# Patient Record
Sex: Female | Born: 1965 | Race: White | Hispanic: No | Marital: Married | Smoking: Never smoker
Health system: Southern US, Community
[De-identification: ages and names within clinical notes are randomized; demographics above are authoritative.]

## PROBLEM LIST (undated history)

## (undated) DIAGNOSIS — K9041 Non-celiac gluten sensitivity: Secondary | ICD-10-CM

## (undated) DIAGNOSIS — M720 Palmar fascial fibromatosis [Dupuytren]: Secondary | ICD-10-CM

## (undated) HISTORY — DX: Non-celiac gluten sensitivity: K90.41

## (undated) HISTORY — DX: Palmar fascial fibromatosis (dupuytren): M72.0

---

## 2001-09-25 LAB — HM COLONOSCOPY: HM Colonoscopy: NORMAL

## 2004-09-20 ENCOUNTER — Ambulatory Visit: Payer: Self-pay | Admitting: Obstetrics and Gynecology

## 2005-12-03 ENCOUNTER — Ambulatory Visit: Payer: Self-pay | Admitting: Obstetrics and Gynecology

## 2006-12-24 ENCOUNTER — Ambulatory Visit: Payer: Self-pay | Admitting: Obstetrics and Gynecology

## 2008-03-30 ENCOUNTER — Ambulatory Visit: Payer: Self-pay | Admitting: Obstetrics and Gynecology

## 2009-05-17 ENCOUNTER — Ambulatory Visit: Payer: Self-pay | Admitting: Obstetrics and Gynecology

## 2010-09-26 ENCOUNTER — Ambulatory Visit: Payer: Self-pay | Admitting: Obstetrics and Gynecology

## 2011-03-30 ENCOUNTER — Emergency Department: Payer: Self-pay | Admitting: Unknown Physician Specialty

## 2011-07-27 LAB — HM PAP SMEAR

## 2011-11-28 ENCOUNTER — Ambulatory Visit: Payer: Self-pay | Admitting: Obstetrics and Gynecology

## 2012-09-25 ENCOUNTER — Ambulatory Visit (INDEPENDENT_AMBULATORY_CARE_PROVIDER_SITE_OTHER): Payer: BC Managed Care – PPO | Admitting: Internal Medicine

## 2012-09-25 ENCOUNTER — Encounter: Payer: Self-pay | Admitting: Internal Medicine

## 2012-09-25 VITALS — BP 121/76 | HR 77 | Temp 98.6°F | Ht 64.5 in | Wt 131.0 lb

## 2012-09-25 DIAGNOSIS — Z309 Encounter for contraceptive management, unspecified: Secondary | ICD-10-CM

## 2012-09-25 DIAGNOSIS — Z0000 Encounter for general adult medical examination without abnormal findings: Secondary | ICD-10-CM

## 2012-09-25 DIAGNOSIS — IMO0001 Reserved for inherently not codable concepts without codable children: Secondary | ICD-10-CM

## 2012-09-25 DIAGNOSIS — Z23 Encounter for immunization: Secondary | ICD-10-CM

## 2012-09-25 LAB — COMPREHENSIVE METABOLIC PANEL
ALT: 19 U/L (ref 0–35)
BUN: 10 mg/dL (ref 6–23)
CO2: 31 mEq/L (ref 19–32)
Calcium: 9 mg/dL (ref 8.4–10.5)
Chloride: 102 mEq/L (ref 96–112)
Creatinine, Ser: 0.8 mg/dL (ref 0.4–1.2)
GFR: 86.21 mL/min (ref >60.00)

## 2012-09-25 LAB — CBC WITH DIFFERENTIAL/PLATELET
Basophils Relative: 0.9 % (ref 0.0–3.0)
Hemoglobin: 13.2 g/dL (ref 12.0–15.0)
Lymphocytes Relative: 27.2 % (ref 12.0–46.0)
Monocytes Relative: 5.7 % (ref 3.0–12.0)
Neutro Abs: 3.4 10*3/uL (ref 1.4–7.7)
RBC: 4.48 Mil/uL (ref 3.87–5.11)

## 2012-09-25 LAB — LDL CHOLESTEROL, DIRECT: Direct LDL: 146.4 mg/dL

## 2012-09-25 LAB — LIPID PANEL
Cholesterol: 212 mg/dL — ABNORMAL HIGH (ref 0–200)
HDL: 46.2 mg/dL (ref >39.00)
VLDL: 33.2 mg/dL (ref 0.0–40.0)

## 2012-09-25 MED ORDER — LEVONORGESTREL-ETHINYL ESTRAD 0.1-20 MG-MCG PO TABS: 1.0000 | ORAL_TABLET | Freq: Every day | ORAL | Status: DC

## 2012-09-26 DIAGNOSIS — IMO0001 Reserved for inherently not codable concepts without codable children: Secondary | ICD-10-CM | POA: Insufficient documentation

## 2012-09-26 DIAGNOSIS — Z0000 Encounter for general adult medical examination without abnormal findings: Secondary | ICD-10-CM | POA: Insufficient documentation

## 2012-09-26 LAB — VITAMIN D 25 HYDROXY (VIT D DEFICIENCY, FRACTURES): Vit D, 25-Hydroxy: 43 ng/mL (ref 30–89)

## 2012-09-26 NOTE — Assessment & Plan Note (Signed)
General medical exam is normal today. Patient follows a healthy diet and gets regular physical activity. Will check basic lab work including CBC, CMP, lipid profile, TSH, vitamin D. Health maintenance including vaccinations are up-to-date. Will obtain previous records including previous reports on Pap and mammogram. Followup one year or sooner as needed.

## 2012-09-26 NOTE — Assessment & Plan Note (Signed)
Will refill oral contraceptive pill today. Will obtain records on previous GYN evaluation including Pap.

## 2012-09-26 NOTE — Progress Notes (Signed)
Subjective:    Patient ID: Leslie Ruiz, female    DOB: November 20, 1966, 46 y.o.   MRN: [id number]  HPI 46 year old female presents to establish care. She reports that she is generally feeling well. She notes that several years ago, she was told she might have fibromyalgia because of diffuse and pains. She ultimately decided to follow a gluten-free diet and reports that symptoms completely resolved after this change. She follows a healthy diet and gets regular physical activity by walking and horseback riding. She denies any concerns about her health. She takes oral contraceptive pill and recently had her menstrual cycle. She reports the past and has been normal. She is up-to-date on other health maintenance, with the exception of lab work.  Outpatient Encounter Prescriptions as of 09/25/2012  Medication Sig Dispense Refill  . levonorgestrel-ethinyl estradiol (AVIANE) 0.1-20 MG-MCG tablet Take 1 tablet by mouth daily.  1 Package  11   BP 121/76  Pulse 77  Temp 98.6 F (37 C) (Oral)  Ht 5' 4.5" (1.638 m)  Wt 131 lb (59.421 kg)  BMI 22.14 kg/m2  SpO2 100%  LMP 09/21/2012  Review of Systems  Constitutional: Negative for fever, chills, appetite change, fatigue and unexpected weight change.  HENT: Negative for ear pain, congestion, sore throat, trouble swallowing, neck pain, voice change and sinus pressure.   Eyes: Negative for visual disturbance.  Respiratory: Negative for cough, shortness of breath, wheezing and stridor.   Cardiovascular: Negative for chest pain, palpitations and leg swelling.  Gastrointestinal: Negative for nausea, vomiting, abdominal pain, diarrhea, constipation, blood in stool, abdominal distention and anal bleeding.  Genitourinary: Negative for dysuria and flank pain.  Musculoskeletal: Negative for myalgias, arthralgias and gait problem.  Skin: Negative for color change and rash.  Neurological: Negative for dizziness and headaches.  Hematological: Negative for  adenopathy. Does not bruise/bleed easily.  Psychiatric/Behavioral: Negative for suicidal ideas, disturbed wake/sleep cycle and dysphoric mood. The patient is not nervous/anxious.        Objective:   Physical Exam  Constitutional: She is oriented to person, place, and time. She appears well-developed and well-nourished. No distress.  HENT:  Head: Normocephalic and atraumatic.  Right Ear: External ear normal.  Left Ear: External ear normal.  Nose: Nose normal.  Mouth/Throat: Oropharynx is clear and moist. No oropharyngeal exudate.  Eyes: Conjunctivae normal are normal. Pupils are equal, round, and reactive to light. Right eye exhibits no discharge. Left eye exhibits no discharge. No scleral icterus.  Neck: Normal range of motion. Neck supple. No tracheal deviation present. No thyromegaly present.  Cardiovascular: Normal rate, regular rhythm, normal heart sounds and intact distal pulses.  Exam reveals no gallop and no friction rub.   No murmur heard. Pulmonary/Chest: Effort normal and breath sounds normal. No respiratory distress. She has no wheezes. She has no rales. She exhibits no tenderness.  Abdominal: Soft. Bowel sounds are normal. She exhibits no distension and no mass. There is no tenderness. There is no rebound and no guarding.  Musculoskeletal: Normal range of motion. She exhibits no edema and no tenderness.  Lymphadenopathy:    She has no cervical adenopathy.  Neurological: She is alert and oriented to person, place, and time. No cranial nerve deficit. She exhibits normal muscle tone. Coordination normal.  Skin: Skin is warm and dry. No rash noted. She is not diaphoretic. No erythema. No pallor.  Psychiatric: She has a normal mood and affect. Her behavior is normal. Judgment and thought content normal.  Assessment & Plan:

## 2012-09-29 ENCOUNTER — Encounter: Payer: Self-pay | Admitting: *Deleted

## 2012-09-29 NOTE — Progress Notes (Signed)
Result letter mailed to patient's home address.

## 2012-11-30 ENCOUNTER — Ambulatory Visit: Payer: Self-pay | Admitting: Obstetrics and Gynecology

## 2012-12-02 ENCOUNTER — Ambulatory Visit: Payer: Self-pay | Admitting: Obstetrics and Gynecology

## 2013-01-18 ENCOUNTER — Telehealth: Payer: Self-pay | Admitting: Internal Medicine

## 2013-01-18 MED ORDER — PENCICLOVIR 1 % EX CREA: TOPICAL_CREAM | CUTANEOUS | Status: DC

## 2013-01-18 NOTE — Telephone Encounter (Signed)
Could you please advise patient about this?

## 2013-01-18 NOTE — Telephone Encounter (Signed)
Called pt she is meeting wit ha client first thing at 8 am and is wondering if there was anything over the counter she could try. She is really stressing out because she has to meet with clients in the morning and needing these to go away as quickly as possible. She says she can't really come in the office and was wanting to know if she can't have anything called in without being seen if there was something over the counter she could take or try ???

## 2013-01-18 NOTE — Telephone Encounter (Signed)
Called pt Left message on machine  

## 2013-01-18 NOTE — Telephone Encounter (Signed)
I can see her at 8:00 in the am.  Block the 8:15 slot.  Thanks.

## 2013-01-18 NOTE — Telephone Encounter (Signed)
Prescription sent in for denavir.  Pt informed.  Will let us know if problems.

## 2013-01-18 NOTE — Telephone Encounter (Signed)
Pt has had cold sore for a week and is meeting with clients this week and is needing them to go away. She has tried Abreva and Orajel but they have not worked well. Can you recommend anything else or call in rx strength for pt ??? Pt uses [facility] in [place]

## 2013-06-04 ENCOUNTER — Encounter: Payer: Self-pay | Admitting: Adult Health

## 2013-06-04 ENCOUNTER — Ambulatory Visit: Payer: Self-pay | Admitting: Adult Health

## 2013-06-04 ENCOUNTER — Ambulatory Visit (INDEPENDENT_AMBULATORY_CARE_PROVIDER_SITE_OTHER): Payer: BC Managed Care – PPO | Admitting: Adult Health

## 2013-06-04 VITALS — BP 118/70 | HR 95 | Temp 98.3°F | Resp 12 | Wt 130.0 lb

## 2013-06-04 DIAGNOSIS — M79609 Pain in unspecified limb: Secondary | ICD-10-CM

## 2013-06-04 DIAGNOSIS — M79642 Pain in left hand: Secondary | ICD-10-CM | POA: Insufficient documentation

## 2013-06-04 NOTE — Progress Notes (Signed)
  Subjective:    Patient ID: Leslie Ruiz, female    DOB: Feb 17, 1966, 47 y.o.   MRN: [id number]  HPI  Is a pleasant 47 year old female who presents to clinic with complaints of left hand pain. She is unable to fully extend the left ring finger. She has woken up with the finger flexed with gradual extension throughout the day. Painful to fully extend. She does not recall a specific injury to the finger. She reports riding horses and may have hurt hand while riding but she does not recall feeling pain or any specific event when this started. She reports that this has been ongoing since April. She has not seen anybody for this problem thinking that it would subside on its own. She has not taken any medication.   Current Outpatient Prescriptions on File Prior to Visit  Medication Sig Dispense Refill  . levonorgestrel-ethinyl estradiol (AVIANE) 0.1-20 MG-MCG tablet Take 1 tablet by mouth daily.  1 Package  11   No current facility-administered medications on file prior to visit.     Review of Systems  Musculoskeletal:       Pain left hand. Unable to extend ring finger       Objective:   Physical Exam  Constitutional: She is oriented to person, place, and time. She appears well-developed and well-nourished. No distress.  Musculoskeletal:  No pain upon palpating left phalanges. There is slight discomfort with palpation of 4th metacarpus. There is a slight protrusion palpated at the distal 4th metarcarpus. There is no swelling noted.   Neurological: She is alert and oriented to person, place, and time. She has normal reflexes.  Skin: Skin is warm and dry.  Psychiatric: She has a normal mood and affect. Her behavior is normal. Thought content normal.          Assessment & Plan:

## 2013-06-04 NOTE — Patient Instructions (Addendum)
  Please go to the [facility] at [facility] for xray of the left hand.  I will notify you of results once they are available.  Possible referral to hand specialist.

## 2013-06-04 NOTE — Assessment & Plan Note (Signed)
Pain is mainly involving the ring finger of the left hand with inability to fully extend. Suspect there may be involvement of the tendon of the flexor digitorum superficialis muscle. The protrusion that is palpated over the fourth metacarpal at the distal end may be 2/2 spur vs. Cyst. Will send for xray of left hand to r/o bone deformity, fracture, etc.  Consider referral to hand specialist.

## 2013-06-09 ENCOUNTER — Telehealth: Payer: Self-pay | Admitting: *Deleted

## 2013-06-09 ENCOUNTER — Other Ambulatory Visit: Payer: Self-pay | Admitting: Adult Health

## 2013-06-09 NOTE — Telephone Encounter (Signed)
Left message, notifying referral in progress.

## 2013-06-09 NOTE — Telephone Encounter (Signed)
Message copied by Dema Severin on Wed Jun 09, 2013  8:37 AM ------      Message from: Novella Olive      Created: Mon Jun 07, 2013  8:35 AM       Xray of the hand was normal which is what I suspected. Patient may take over the counter anti inflammatories such as ibuprofen 600 mg q6h prn for pain. Apply ice alternating with heat. Massage the area and gently try to flex and extend fingers. She can use a splint to rest and immobilize the hand with fingers extended. If no improvement in 7-10 days can refer to hand specialist. ------

## 2013-06-09 NOTE — Telephone Encounter (Signed)
Ordered referral.

## 2013-06-09 NOTE — Telephone Encounter (Signed)
Called and advised patient of results. Pt requests referral to hand specialist now as this has been "going on since April".

## 2013-06-18 ENCOUNTER — Encounter: Payer: Self-pay | Admitting: Adult Health

## 2013-09-30 ENCOUNTER — Encounter: Payer: Self-pay | Admitting: Internal Medicine

## 2013-09-30 ENCOUNTER — Ambulatory Visit (INDEPENDENT_AMBULATORY_CARE_PROVIDER_SITE_OTHER): Payer: BC Managed Care – PPO | Admitting: Internal Medicine

## 2013-09-30 ENCOUNTER — Encounter (INDEPENDENT_AMBULATORY_CARE_PROVIDER_SITE_OTHER): Payer: Self-pay

## 2013-09-30 VITALS — BP 118/72 | HR 86 | Temp 98.5°F | Ht 64.5 in | Wt 132.0 lb

## 2013-09-30 DIAGNOSIS — M79642 Pain in left hand: Secondary | ICD-10-CM

## 2013-09-30 DIAGNOSIS — M79609 Pain in unspecified limb: Secondary | ICD-10-CM

## 2013-09-30 DIAGNOSIS — Z23 Encounter for immunization: Secondary | ICD-10-CM

## 2013-09-30 DIAGNOSIS — Z0000 Encounter for general adult medical examination without abnormal findings: Secondary | ICD-10-CM | POA: Insufficient documentation

## 2013-09-30 LAB — CBC WITH DIFFERENTIAL/PLATELET
Basophils Absolute: 0 10*3/uL (ref 0.0–0.1)
Eosinophils Absolute: 0.1 10*3/uL (ref 0.0–0.7)
HCT: 38.2 % (ref 36.0–46.0)
Hemoglobin: 12.8 g/dL (ref 12.0–15.0)
Lymphs Abs: 1.3 10*3/uL (ref 0.7–4.0)
MCHC: 33.6 g/dL (ref 30.0–36.0)
MCV: 86.7 fl (ref 78.0–100.0)
Monocytes Absolute: 0.3 10*3/uL (ref 0.1–1.0)
Neutro Abs: 4.2 10*3/uL (ref 1.4–7.7)
RDW: 13.6 % (ref 11.5–14.6)

## 2013-09-30 LAB — MICROALBUMIN / CREATININE URINE RATIO
Creatinine,U: 66.2 mg/dL
Microalb Creat Ratio: 0.9 mg/g (ref 0.0–30.0)
Microalb, Ur: 0.6 mg/dL (ref 0.0–1.9)

## 2013-09-30 NOTE — Assessment & Plan Note (Signed)
Will set up evaluation with Dr. Solon Augusta at [facility] to see if additional intervention might be helpful, given no improvement with steroid injection.

## 2013-09-30 NOTE — Progress Notes (Signed)
Pre-visit discussion using our clinic review tool. No additional management support is needed unless otherwise documented below in the visit note.  

## 2013-09-30 NOTE — Progress Notes (Signed)
Subjective:    Patient ID: Leslie Ruiz, female    DOB: 1966-09-30, 47 y.o.   MRN: [id number]  HPI 47 year old female presents for annual exam. She reports she is generally feeling well. She continues to follow a gluten-free diet. She reports that symptoms of bowel cramping and diarrhea are completely resolved with gluten-free diet. She also exercises regularly. No new concerns today. She is scheduled for GYN exam with Pap smear with her OB/GYN later this month. Mammogram is pending. Colonoscopy is up-to-date.  She was seen in clinic earlier this year for left hand pain diagnosed as Dupuytren contracture. She was seen by an orthopedic surgeon who performed steroid injection. She reports that she initially had some benefit but now has persistent contracture of her left fourth finger.  Outpatient Encounter Prescriptions as of 09/30/2013  Medication Sig  . ESTRACE VAGINAL 0.1 MG/GM vaginal cream   . levonorgestrel-ethinyl estradiol (AVIANE) 0.1-20 MG-MCG tablet Take 1 tablet by mouth daily.   BP 118/72  Pulse 86  Temp(Src) 98.5 F (36.9 C) (Oral)  Ht 5' 4.5" (1.638 m)  Wt 132 lb (59.875 kg)  BMI 22.32 kg/m2  SpO2 98%  Review of Systems  Constitutional: Negative for fever, chills, appetite change, fatigue and unexpected weight change.  HENT: Negative for congestion, ear pain, sinus pressure, sore throat, trouble swallowing and voice change.   Eyes: Negative for visual disturbance.  Respiratory: Negative for cough, shortness of breath, wheezing and stridor.   Cardiovascular: Negative for chest pain, palpitations and leg swelling.  Gastrointestinal: Negative for nausea, vomiting, abdominal pain, diarrhea, constipation, blood in stool, abdominal distention and anal bleeding.  Genitourinary: Negative for dysuria and flank pain.  Musculoskeletal: Negative for arthralgias, gait problem, myalgias and neck pain.  Skin: Negative for color change and rash.  Neurological: Negative for dizziness  and headaches.  Hematological: Negative for adenopathy. Does not bruise/bleed easily.  Psychiatric/Behavioral: Negative for suicidal ideas, sleep disturbance and dysphoric mood. The patient is not nervous/anxious.        Objective:   Physical Exam  Constitutional: She is oriented to person, place, and time. She appears well-developed and well-nourished. No distress.  HENT:  Head: Normocephalic and atraumatic.  Right Ear: External ear normal.  Left Ear: External ear normal.  Nose: Nose normal.  Mouth/Throat: Oropharynx is clear and moist. No oropharyngeal exudate.  Eyes: Conjunctivae are normal. Pupils are equal, round, and reactive to light. Right eye exhibits no discharge. Left eye exhibits no discharge. No scleral icterus.  Neck: Normal range of motion. Neck supple. No tracheal deviation present. No thyromegaly present.  Cardiovascular: Normal rate, regular rhythm, normal heart sounds and intact distal pulses.  Exam reveals no gallop and no friction rub.   No murmur heard. Pulmonary/Chest: Effort normal and breath sounds normal. No accessory muscle usage. Not tachypneic. No respiratory distress. She has no decreased breath sounds. She has no wheezes. She has no rales.  Abdominal: Soft. Bowel sounds are normal. She exhibits no distension and no mass. There is no tenderness. There is no rebound and no guarding.  Musculoskeletal: Normal range of motion. She exhibits no edema and no tenderness.       Hands: Lymphadenopathy:    She has no cervical adenopathy.  Neurological: She is alert and oriented to person, place, and time. No cranial nerve deficit. She exhibits normal muscle tone. Coordination normal.  Skin: Skin is warm and dry. No rash noted. She is not diaphoretic. No erythema. No pallor.  Psychiatric: She has a normal mood  and affect. Her behavior is normal. Judgment and thought content normal.          Assessment & Plan:

## 2013-09-30 NOTE — Assessment & Plan Note (Signed)
General medical exam normal today. Scheduled for PAP and pelvic with OB later this month. Encouraged continued healthy diet and regular exercise. Labs today CBC, CMP, lipids, TSH, Vit D. Follow up 1 year and prn.

## 2013-10-01 ENCOUNTER — Encounter: Payer: Self-pay | Admitting: *Deleted

## 2013-10-01 LAB — LIPID PANEL
Cholesterol: 211 mg/dL — ABNORMAL HIGH (ref 0–200)
HDL: 49 mg/dL (ref >39.00)
Total CHOL/HDL Ratio: 4
Triglycerides: 139 mg/dL (ref 0.0–149.0)
VLDL: 27.8 mg/dL (ref 0.0–40.0)

## 2013-10-01 LAB — COMPREHENSIVE METABOLIC PANEL
AST: 24 U/L (ref 0–37)
BUN: 13 mg/dL (ref 6–23)
CO2: 31 mEq/L (ref 19–32)
Calcium: 9.3 mg/dL (ref 8.4–10.5)
Chloride: 102 mEq/L (ref 96–112)
Creatinine, Ser: 0.7 mg/dL (ref 0.4–1.2)
GFR: 94.39 mL/min (ref >60.00)
Total Bilirubin: 0.4 mg/dL (ref 0.3–1.2)

## 2013-10-25 DIAGNOSIS — M720 Palmar fascial fibromatosis [Dupuytren]: Secondary | ICD-10-CM | POA: Insufficient documentation

## 2013-12-06 ENCOUNTER — Ambulatory Visit: Payer: Self-pay | Admitting: Obstetrics and Gynecology

## 2013-12-31 LAB — HM MAMMOGRAPHY: HM MAMMO: NORMAL

## 2014-06-01 ENCOUNTER — Encounter: Payer: Self-pay | Admitting: Internal Medicine

## 2014-06-01 ENCOUNTER — Ambulatory Visit (INDEPENDENT_AMBULATORY_CARE_PROVIDER_SITE_OTHER): Payer: BC Managed Care – PPO | Admitting: Internal Medicine

## 2014-06-01 VITALS — BP 110/70 | HR 110 | Temp 98.5°F | Ht 64.5 in | Wt 126.2 lb

## 2014-06-01 DIAGNOSIS — R0982 Postnasal drip: Secondary | ICD-10-CM

## 2014-06-01 DIAGNOSIS — R42 Dizziness and giddiness: Secondary | ICD-10-CM

## 2014-06-01 LAB — COMPREHENSIVE METABOLIC PANEL
ALBUMIN: 3.9 g/dL (ref 3.5–5.2)
ALT: 19 U/L (ref 0–35)
AST: 27 U/L (ref 0–37)
Alkaline Phosphatase: 71 U/L (ref 39–117)
BUN: 10 mg/dL (ref 6–23)
CALCIUM: 9.9 mg/dL (ref 8.4–10.5)
CHLORIDE: 102 meq/L (ref 96–112)
CO2: 23 mEq/L (ref 19–32)
Creatinine, Ser: 0.8 mg/dL (ref 0.4–1.2)
GFR: 78.43 mL/min (ref >60.00)
Glucose, Bld: 96 mg/dL (ref 70–99)
POTASSIUM: 5 meq/L (ref 3.5–5.1)
SODIUM: 139 meq/L (ref 135–145)
TOTAL PROTEIN: 7.8 g/dL (ref 6.0–8.3)
Total Bilirubin: 0.3 mg/dL (ref 0.2–1.2)

## 2014-06-01 LAB — CBC WITH DIFFERENTIAL/PLATELET
BASOS PCT: 0.4 % (ref 0.0–3.0)
Basophils Absolute: 0 10*3/uL (ref 0.0–0.1)
Eosinophils Absolute: 0.1 10*3/uL (ref 0.0–0.7)
Eosinophils Relative: 2.1 % (ref 0.0–5.0)
HCT: 40.6 % (ref 36.0–46.0)
Hemoglobin: 13.7 g/dL (ref 12.0–15.0)
Lymphocytes Relative: 17.7 % (ref 12.0–46.0)
Lymphs Abs: 1.2 10*3/uL (ref 0.7–4.0)
MCHC: 33.7 g/dL (ref 30.0–36.0)
MCV: 86.8 fl (ref 78.0–100.0)
MONO ABS: 0.3 10*3/uL (ref 0.1–1.0)
Monocytes Relative: 5.2 % (ref 3.0–12.0)
NEUTROS PCT: 74.6 % (ref 43.0–77.0)
Neutro Abs: 4.9 10*3/uL (ref 1.4–7.7)
PLATELETS: 307 10*3/uL (ref 150.0–400.0)
RBC: 4.68 Mil/uL (ref 3.87–5.11)
RDW: 13.2 % (ref 11.5–15.5)
WBC: 6.6 10*3/uL (ref 4.0–10.5)

## 2014-06-01 LAB — T4, FREE: Free T4: 0.86 ng/dL (ref 0.60–1.60)

## 2014-06-01 LAB — TSH: TSH: 0.36 u[IU]/mL (ref 0.35–4.50)

## 2014-06-01 NOTE — Progress Notes (Signed)
Pre visit review using our clinic review tool, if applicable. No additional management support is needed unless otherwise documented below in the visit note. 

## 2014-06-01 NOTE — Patient Instructions (Signed)
Labs today.  We will call you with results. 

## 2014-06-01 NOTE — Assessment & Plan Note (Signed)
We discussed potential causes of dizziness. Symptoms sound most consistent with BPV, however constellation of other symptoms and exam suggest possible hyperthyroidism. Will check thyroid function with labs. Discussed using Meclizine, however she has not tolerated this well in the past. If labs normal, discussed ENT evaluation to better assess middle ear pressure and consider Epley maneuver.

## 2014-06-01 NOTE — Progress Notes (Signed)
Subjective:    Patient ID: Leslie Ruiz, female    DOB: 11/22/1966, 48 y.o.   MRN: [id number]  HPI 48YO female presents for acute visit.  Dizziness - Having vertigo and lightheadedness for 1 week, starting last Tuesday. Similar episode occurred in June. Worsened by movement of her head. No recent fever. Some headache with aching in base of neck, not severe. Alleviated with tylenol. No rash. Some congestion last week. Having diarrhea last couple of days.  Feeling shaky between meals. Feels the need to clear her throat constantly, which has been going on for months. No trouble swallowing.  Review of Systems  Constitutional: Negative for fever, chills, appetite change, fatigue and unexpected weight change.  HENT: Positive for congestion, postnasal drip and rhinorrhea. Negative for sore throat, trouble swallowing and voice change.   Eyes: Negative for visual disturbance.  Respiratory: Negative for shortness of breath, wheezing and stridor.   Cardiovascular: Negative for chest pain, palpitations and leg swelling.  Gastrointestinal: Negative for abdominal pain.  Skin: Negative for color change and rash.  Neurological: Positive for dizziness, light-headedness and headaches. Negative for tremors, syncope, weakness and numbness.  Hematological: Negative for adenopathy. Does not bruise/bleed easily.  Psychiatric/Behavioral: Negative for dysphoric mood. The patient is not nervous/anxious.        Objective:    BP 110/70  Pulse 110  Temp(Src) 98.5 F (36.9 C) (Oral)  Ht 5' 4.5" (1.638 m)  Wt 126 lb 4 oz (57.267 kg)  BMI 21.34 kg/m2  SpO2 97% Physical Exam  Constitutional: She is oriented to person, place, and time. She appears well-developed and well-nourished. No distress.  HENT:  Head: Normocephalic and atraumatic.  Right Ear: External ear normal. A middle ear effusion is present.  Left Ear: External ear normal. A middle ear effusion is present.  Nose: Nose normal.  Mouth/Throat:  Oropharynx is clear and moist. No oropharyngeal exudate.  Eyes: Conjunctivae are normal. Pupils are equal, round, and reactive to light. Right eye exhibits no discharge. Left eye exhibits no discharge. No scleral icterus.  Neck: Normal range of motion. Neck supple. No tracheal deviation present. Thyromegaly present. No mass present.  Cardiovascular: Normal rate, regular rhythm, normal heart sounds and intact distal pulses.  Exam reveals no gallop and no friction rub.   No murmur heard. Pulmonary/Chest: Effort normal and breath sounds normal. No accessory muscle usage. Not tachypneic. No respiratory distress. She has no decreased breath sounds. She has no wheezes. She has no rhonchi. She has no rales. She exhibits no tenderness.  Musculoskeletal: Normal range of motion. She exhibits no edema and no tenderness.  Lymphadenopathy:    She has no cervical adenopathy.  Neurological: She is alert and oriented to person, place, and time. She is not disoriented. She displays no tremor. No cranial nerve deficit. She exhibits normal muscle tone. Coordination and gait normal.  Skin: Skin is warm and dry. No rash noted. She is not diaphoretic. No erythema. No pallor.  Psychiatric: She has a normal mood and affect. Her behavior is normal. Judgment and thought content normal.          Assessment & Plan:   Problem List Items Addressed This Visit     Unprioritized   Dizziness - Primary     We discussed potential causes of dizziness. Symptoms sound most consistent with BPV, however constellation of other symptoms and exam suggest possible hyperthyroidism. Will check thyroid function with labs. Discussed using Meclizine, however she has not tolerated this well in the  past. If labs normal, discussed ENT evaluation to better assess middle ear pressure and consider Epley maneuver.    Relevant Orders      T4, free      TSH      T3      Thyroid antibodies      Comp Met (CMET)      CBC w/Diff      Ambulatory  referral to ENT   Post-nasal drip     Chronic post nasal drip with need to clear throat noted by pt. Exam normal. No symptoms to suggest allergies or GERD. Will set up ENT evaluation for better visualization of posterior pharynx.     Relevant Orders      Ambulatory referral to ENT       Return in about 1 week (around 06/08/2014) for Recheck.

## 2014-06-01 NOTE — Assessment & Plan Note (Signed)
Chronic post nasal drip with need to clear throat noted by pt. Exam normal. No symptoms to suggest allergies or GERD. Will set up ENT evaluation for better visualization of posterior pharynx.

## 2014-06-02 LAB — T3: T3, Total: 185.6 ng/dL (ref 80.0–204.0)

## 2014-06-02 LAB — THYROID ANTIBODIES
Thyroglobulin Ab: <20 IU/mL (ref <40.0)
Thyroperoxidase Ab SerPl-aCnc: 184 IU/mL — ABNORMAL HIGH (ref <35.0)

## 2014-06-06 ENCOUNTER — Ambulatory Visit: Payer: BC Managed Care – PPO | Admitting: Internal Medicine

## 2014-06-06 DIAGNOSIS — Z0289 Encounter for other administrative examinations: Secondary | ICD-10-CM

## 2014-06-08 ENCOUNTER — Encounter: Payer: Self-pay | Admitting: Internal Medicine

## 2014-06-10 NOTE — Telephone Encounter (Signed)
Pt called to inquire about a endocrinology appt that was to be set up. Please call pt to advise/msn

## 2014-06-22 ENCOUNTER — Encounter: Payer: Self-pay | Admitting: Internal Medicine

## 2014-06-23 ENCOUNTER — Ambulatory Visit (INDEPENDENT_AMBULATORY_CARE_PROVIDER_SITE_OTHER): Payer: BC Managed Care – PPO | Admitting: Endocrinology

## 2014-06-23 ENCOUNTER — Encounter: Payer: Self-pay | Admitting: Endocrinology

## 2014-06-23 VITALS — BP 96/60 | HR 90 | Temp 98.4°F | Ht 64.5 in | Wt 127.2 lb

## 2014-06-23 DIAGNOSIS — R7689 Other specified abnormal immunological findings in serum: Secondary | ICD-10-CM | POA: Insufficient documentation

## 2014-06-23 DIAGNOSIS — R42 Dizziness and giddiness: Secondary | ICD-10-CM

## 2014-06-23 DIAGNOSIS — R768 Other specified abnormal immunological findings in serum: Secondary | ICD-10-CM | POA: Insufficient documentation

## 2014-06-23 DIAGNOSIS — R894 Abnormal immunological findings in specimens from other organs, systems and tissues: Secondary | ICD-10-CM

## 2014-06-23 NOTE — Progress Notes (Signed)
Pre visit review using our clinic review tool, if applicable. No additional management support is needed unless otherwise documented below in the visit note. 

## 2014-06-23 NOTE — Progress Notes (Signed)
Reason for visit- High TPO levels, dizziness HPI  Leslie Ruiz is a 48 y.o.-year-old female, referred by her PCP, Wynona DoveWALKER,JENNIFER AZBELL, MD, for evaluation for elevated TPO Ab levels and dizziness.  The patient denies any prior personal history of thyroid problems. She has family history of hyperthyroidism in her mother who required a short course of treatment and is not currently on any thyroid hormone. Sister has hypothyroidism.  Patient denies any recent hospitalization, illness or major change in his medical history. No recent medication changes. Denies tenderness in neck area.  Denies any recent ingestion of seaweed/kelp or recent exposure to iv contrast dye. No recent steroid use or use of herbal supplements.  In early July, she developed an episode of dizziness that was varying in intensity from day to day and lasted for about 1-1.5 weeks. It was followed by a cold and she had a day of sore throat then. Worse during the day with movt of her head.Felt the room was spinning at night time when she lay down. She also reports that she noticed increased palpitations and heat intolerance and belching during this episode. Now these symptoms have resolved.   She denies  any other autoimmune problems in her family. Sister and she have gluten sensitivities.    I reviewed pt's thyroid tests: Lab Results  Component Value Date   TSH 0.36 06/01/2014   TSH 1.34 09/30/2013   TSH 0.95 09/25/2012   FREET4 0.86 06/01/2014    Other pertinent labs are as follows: 06/01/14- Total T3  Of 185.6, TPO  184, TG AB <20  Patient denies feeling nodules in neck, dysphagia, SOB with lying down; Review of systems is negative for  - excessive sweating/heat intolerance - tremors - anxiety - palpitations - problems with concentration - fatigue - diarrhea  She does report some early morning hoarseness and frequent clearing of her throat , worse over the past year. No formal diagnosis of GERD. She does have a history  of post nasal drip. Recent weight loss has been intentional due to change in lifestyle and staying busy.   I have reviewed the patient's past medical history, family and social history, surgical history, medications and allergies.  Past Medical History  Diagnosis Date  . Gluten intolerance    Past Surgical History  Procedure Laterality Date  . Cesarean section     History   Social History  . Marital Status: Married    Spouse Name: N/A    Number of Children: N/A  . Years of Education: N/A   Occupational History  . Not on file.   Social History Main Topics  . Smoking status: Former Games developermoker  . Smokeless tobacco: Not on file  . Alcohol Use: Yes  . Drug Use: No  . Sexual Activity: Not on file   Other Topics Concern  . Not on file   Social History Narrative   [place] husband and daughter 2015. Cat and dog in home.      Diet - gluten free   Exercise - horseback riding, walking      Work - home, [employer]   Current Outpatient Prescriptions on File Prior to Visit  Medication Sig Dispense Refill  . ESTRACE VAGINAL 0.1 MG/GM vaginal cream       . levonorgestrel-ethinyl estradiol (AVIANE) 0.1-20 MG-MCG tablet Take 1 tablet by mouth daily.  1 Package  11   No current facility-administered medications on file prior to visit.   No Known Allergies Family History  Problem Relation Age of Onset  . Arthritis Mother   . Cancer Mother     Lung  . Hypertension Mother   . Kidney disease Mother   . COPD Mother   . Arthritis Father   . Heart disease Father   . COPD Father   . Heart disease Paternal Grandmother   . Hypertension Paternal Grandmother   . Heart disease Paternal Grandfather   . Hypertension Paternal Grandfather   . Celiac disease Sister   . Kidney disease Maternal Uncle 30     ROS: Review of Systems: General:  Denies Recent weight change,Fatigue, Loss of appetite Eyes: Denies Vision Difficulty, Eye pain ENT: Denies Hearing difficulty,  Difficulty Swallowing CVS: Denies Chest pain, Palpitations/Irregular Heart beat, Shortness of breath lying flat, Swelling of legs Resp: Denies Frequent Cough, Shortness of Breath, Wheezing GI: Denies Heartburn, Nausea or Vomiting, Diarrhea, Constipation, Abdominal Pain GU: Denies polyuria, nocturia Bones/joints: Denies Muscle aches, Joint Pain, Bone pain Skin/Hair/Nails: Denies Rash, New stretch marks, Itching, Hair loss, Excessive hair growth Reproduction: Denies Low sexual desire, Women: Menstrual cycle problems, Women: Breast Discharge, Men: Difficulty with erections, Men: Enlarged Breasts CNS: Denies Frequent Headaches, Blurry vision, Tremors, Seizures, Loss of consciousness, Localized weakness Endocrine: Denies Excess thirst, Feeling excessively cold, Reports  Feeling excessively hot Heme: Denies Easy bruising, Enlarged glands or lumps in neck Allergy: Reports Food allergies, Denies Environmental allergies  PE: BP 96/60  Pulse 90  Temp(Src) 98.4 F (36.9 C) (Oral)  Ht 5' 4.5" (1.638 m)  Wt 127 lb 4 oz (57.72 kg)  BMI 21.51 kg/m2  SpO2 99% Wt Readings from Last 3 Encounters:  06/23/14 127 lb 4 oz (57.72 kg)  06/01/14 126 lb 4 oz (57.267 kg)  09/30/13 132 lb (59.875 kg)    HEENT: [place]/AT, EOMI, no icterus, no proptosis, no chemosis, no mild lid lag, no retraction, eyes close completely Neck: thyroid gland - smooth, slight fullness, non-tender, no erythema, no tracheal deviation; negative Pemberton's sign; no lympahadenopathy; no bruits Lungs: good air entry, clear bilaterally Heart: S1&S2 normal, regular rate & rhythm; no murmurs, rubs or gallops Abd: soft, NT, ND, no HSM, +BS Ext: min tremor in hands bilaterally, no edema, 2+ DP/PT pulses, good muscle mass Neuro: normal gait, 2+ reflexes bilaterally, normal 5/5 strength, no proximal myopathy  Derm: no pretibial myxoedema/skin dryness   ASSESSMENT: 1. Dizziness  - Thyroid Profile II; Future - T3, free; Future - T4, free;  Future  2. Anti-TPO antibodies present  - Thyroid Profile II; Future - T3, free; Future - T4, free; Future   PLAN:  Discussed with the patient regarding thyroid hormone physiology, symptoms and signs of hypo- and hyperthyroidism. Discussed that TPO levels are suggestive of autoimmune thyroid problems however not definitively associated with thyroid dysfunction in the future.  Her recent thyroid labs do show a downward trend in her TSH levels, and she does have a family history of thyroid problems. I don't feel a goiter on her exam today.   -I recommend that her thyroid levels be checked in about 5 weeks from now ( 8 weeks from last lab check) to assess trend in thyroid function. - If that is normal, then would perform thyroid testing with TSH, free T4 about 1-2 times yearly with PCP. -Return if symptoms return or worsen. Watch for symptoms of hypo- and hyper-thyroidism.  -She has also noted frequent clearing of her throat with early morning hoarseness for which I have asked her to try OTC antacids to see if her  symptoms improve and are related to undiagnosed GERD.    Dizziness- Unclear etiology but possibly related to vertigo/cold. Unsure whether recent change in TSH levels reflect an episode of mild thyroiditis around the same time. Episode is resolved now.      Rohil Lesch [facility] 06/23/2014  10:25 AM

## 2014-06-23 NOTE — Patient Instructions (Signed)
  High TPO levels indicate that you have an autoimmune tendency to develop thyroid problems, but not definitively indicative thereof. Watch for symptoms of hypo- and hyper-thyroidism.  Check thyroid levels in 5 weeks from now at lab only to reassess thyroid levels, given the recent decline in TSH levels.  If those levels are normal, then could follow back with PCP for thyroid check yearly.   Unclear etiology of recent dizzy spell. Could be vertigo related to recent cold. Other possibility includes thyroiditis, though suspicion is low.  Try OTC antacid for frequent clearing of throat symptom to see if related to GERD.   Follow up as needed.

## 2014-08-02 ENCOUNTER — Encounter: Payer: Self-pay | Admitting: Endocrinology

## 2014-08-02 ENCOUNTER — Ambulatory Visit (INDEPENDENT_AMBULATORY_CARE_PROVIDER_SITE_OTHER): Payer: BC Managed Care – PPO | Admitting: Endocrinology

## 2014-08-02 VITALS — BP 100/62 | HR 81 | Ht 64.5 in | Wt 128.8 lb

## 2014-08-02 DIAGNOSIS — R894 Abnormal immunological findings in specimens from other organs, systems and tissues: Secondary | ICD-10-CM

## 2014-08-02 DIAGNOSIS — R768 Other specified abnormal immunological findings in serum: Secondary | ICD-10-CM

## 2014-08-02 DIAGNOSIS — R42 Dizziness and giddiness: Secondary | ICD-10-CM

## 2014-08-02 LAB — T3, FREE: T3 FREE: 3.3 pg/mL (ref 2.3–4.2)

## 2014-08-02 LAB — T4, FREE: Free T4: 0.82 ng/dL (ref 0.60–1.60)

## 2014-08-02 NOTE — Assessment & Plan Note (Addendum)
She appears to be clinically euthyroid at this time. Will check thyroid labs at today's visit, to assess trend in thyroid function.  She may or may not develop thyroid dysfunction in the future.  Will pursue further workup/treatment if she does.  If Thyroid tests are normal at this time, would recommend that she follow back with PCP and get TSH, free T4 checked 1-2 times yearly.   RTC prn to endocrine clinic. Follow up with PCP for other symptoms.

## 2014-08-02 NOTE — Patient Instructions (Signed)
Labs today.  If labs show thyroid dysfunction, will pursue further workup accordingly.   RTC prn .

## 2014-08-02 NOTE — Progress Notes (Signed)
Reason for visit- Follow up of High TPO levels HPI  Leslie Ruiz is a 48 y.o.-year-old female, for follow up for elevated TPO Ab levels. Last visit with me was about 6 weeks ago.   The patient denies any prior personal history of thyroid problems. She has family history of hyperthyroidism in her mother who required a short course of treatment and is not currently on any thyroid hormone. Sister has hypothyroidism. She denies  any other autoimmune problems in her family. Sister and she have gluten sensitivities.   Patient denies any recent hospitalization, illness or major change in his medical history. No recent medication changes. Denies tenderness in neck area.  Denies any recent ingestion of seaweed/kelp or recent exposure to iv contrast dye. No recent steroid use or use of herbal supplements.   In early July, she developed an episode of dizziness that was varying in intensity from day to day and lasted for about 1-1.5 weeks. It was followed by a cold and she had a day of sore throat then. Dizziness has resolved and not yet recovered since then. No recurrence of dizziness. TPO and thyroid levels were checked as part of that workup.  Last week had 2 days of episodic palpitations and feeling flushed and resolved on its own.  Patient is on continuous birth control for regulation of menses and contraception and plans to continue it till about age 11 per her GYN.      I reviewed pt's thyroid tests: Lab Results  Component Value Date   TSH 0.36 06/01/2014   TSH 1.34 09/30/2013   TSH 0.95 09/25/2012   FREET4 0.86 06/01/2014    Other pertinent labs are as follows: 06/01/14- Total T3  Of 185.6, TPO  184, TG AB <20  Review of systems:  [ x ] complains of   [  ] denies  [  ] weight loss  [  ] tremors [ x ] palpitations [  ] diarrhea [  ] increased anxiety [  ] muscle weakness [ x ] heat intolerance [  ] fatigue  [  ] proptosis  [  ] noticing any enlargement in size of thyroid [  ] lumps in neck [  ]  dysphagia  [  ] change in voice [ x  ] frequent clearing of throat  I have reviewed the patient's past medical history, medications and allergies.   Current Outpatient Prescriptions on File Prior to Visit  Medication Sig Dispense Refill  . ESTRACE VAGINAL 0.1 MG/GM vaginal cream       . levonorgestrel-ethinyl estradiol (AVIANE) 0.1-20 MG-MCG tablet Take 1 tablet by mouth daily.  1 Package  11   No current facility-administered medications on file prior to visit.   No Known Allergies   PE: BP 100/62  Pulse 81  Ht 5' 4.5" (1.638 m)  Wt 128 lb 12 oz (58.401 kg)  BMI 21.77 kg/m2  SpO2 99% Wt Readings from Last 3 Encounters:  08/02/14 128 lb 12 oz (58.401 kg)  06/23/14 127 lb 4 oz (57.72 kg)  06/01/14 126 lb 4 oz (57.267 kg)    HEENT: [place]/AT, EOMI, no icterus, no proptosis, no chemosis, no mild lid lag, no retraction, eyes close completely Neck: thyroid gland - smooth, slight fullness, non-tender, no erythema, no tracheal deviation; negative Pemberton's sign; no lympahadenopathy; no bruits Lungs: good air entry, clear bilaterally Heart: S1&S2 normal, regular rate & rhythm; no murmurs, rubs or gallops Abd: soft, NT, ND, no HSM, +BS Ext: no  tremor in hands bilaterally, no edema, 2+ DP/PT pulses, good muscle mass Neuro: normal gait, 2+ reflexes bilaterally, normal 5/5 strength, no proximal myopathy  Derm: no pretibial myxoedema/skin dryness   ASSESSMENT AND PLAN:  Problem List Items Addressed This Visit     Other   Anti-TPO antibodies present - Primary     She appears to be clinically euthyroid at this time. Will check thyroid labs at today's visit, to assess trend in thyroid function.  She may or may not develop thyroid dysfunction in the future.  Will pursue further workup/treatment if she does.  If Thyroid tests are normal at this time, would recommend that she follow back with PCP and get TSH, free T4 checked 1-2 times yearly.   RTC prn to endocrine clinic. Follow up with  PCP for other symptoms.           Ermal Brzozowski [facility] 08/02/2014  2:08 PM

## 2014-08-02 NOTE — Progress Notes (Signed)
Pre visit review using our clinic review tool, if applicable. No additional management support is needed unless otherwise documented below in the visit note. 

## 2014-08-03 ENCOUNTER — Encounter: Payer: Self-pay | Admitting: *Deleted

## 2014-08-03 LAB — THYROID PROFILE II
Free Thyroxine Index: 2.1 (ref 1.2–4.9)
T3 Uptake Ratio: 24 % (ref 24–39)
T3, Total: 140 ng/dL (ref 71–180)
T4, Total: 8.9 ug/dL (ref 4.5–12.0)
TSH: 0.27 u[IU]/mL — AB (ref 0.450–4.500)

## 2014-08-10 ENCOUNTER — Encounter: Payer: Self-pay | Admitting: *Deleted

## 2014-08-10 LAB — THYROID STIMULATING IMMUNOGLOBULIN: TSI: 165 % — ABNORMAL HIGH (ref 0–139)

## 2014-08-10 LAB — SPECIMEN STATUS REPORT

## 2014-09-30 ENCOUNTER — Other Ambulatory Visit: Payer: Self-pay | Admitting: Endocrinology

## 2014-09-30 ENCOUNTER — Ambulatory Visit (INDEPENDENT_AMBULATORY_CARE_PROVIDER_SITE_OTHER): Payer: BC Managed Care – PPO | Admitting: *Deleted

## 2014-09-30 ENCOUNTER — Encounter: Payer: Self-pay | Admitting: Internal Medicine

## 2014-09-30 ENCOUNTER — Ambulatory Visit (INDEPENDENT_AMBULATORY_CARE_PROVIDER_SITE_OTHER): Payer: BC Managed Care – PPO | Admitting: Internal Medicine

## 2014-09-30 VITALS — BP 118/58 | HR 95 | Temp 98.4°F | Ht 64.5 in | Wt 132.2 lb

## 2014-09-30 DIAGNOSIS — Z1211 Encounter for screening for malignant neoplasm of colon: Secondary | ICD-10-CM

## 2014-09-30 DIAGNOSIS — R768 Other specified abnormal immunological findings in serum: Secondary | ICD-10-CM

## 2014-09-30 DIAGNOSIS — Z0000 Encounter for general adult medical examination without abnormal findings: Secondary | ICD-10-CM

## 2014-09-30 DIAGNOSIS — Z23 Encounter for immunization: Secondary | ICD-10-CM

## 2014-09-30 NOTE — Progress Notes (Signed)
Pre visit review using our clinic review tool, if applicable. No additional management support is needed unless otherwise documented below in the visit note. 

## 2014-09-30 NOTE — Patient Instructions (Signed)

## 2014-09-30 NOTE — Assessment & Plan Note (Signed)
General medical exam normal today. Pelvic and breast exam deferred as scheduled with her OB next week. Encouraged healthy diet and exercise. Flu vaccine today. Will check basic labs including CBC, CMP, lipids next month fasting.

## 2014-09-30 NOTE — Progress Notes (Signed)
Subjective:    Patient ID: Leslie Ruiz, female    DOB: 04/23/1966, 48 y.o.   MRN: [id number]  HPI 48YO female presents for annual exam.  Has [facility] next week.  Physically active, but no scheduled exercise. Follows healthy diet. Feeling well with good energy level. No changes in bowel habits. Due for screening colonoscopy. No concerns today.   Review of Systems  Constitutional: Negative for fever, chills, appetite change, fatigue and unexpected weight change.  Eyes: Negative for visual disturbance.  Respiratory: Negative for shortness of breath.   Cardiovascular: Negative for chest pain and leg swelling.  Gastrointestinal: Negative for nausea, vomiting, abdominal pain, diarrhea and constipation.  Musculoskeletal: Negative for myalgias and arthralgias.  Skin: Negative for color change and rash.  Hematological: Negative for adenopathy. Does not bruise/bleed easily.  Psychiatric/Behavioral: Negative for sleep disturbance and dysphoric mood. The patient is not nervous/anxious.        Objective:    BP 118/58 mmHg  Pulse 95  Temp(Src) 98.4 F (36.9 C) (Oral)  Ht 5' 4.5" (1.638 m)  Wt 132 lb 4 oz (59.988 kg)  BMI 22.36 kg/m2  SpO2 98% Physical Exam  Constitutional: She is oriented to person, place, and time. She appears well-developed and well-nourished. No distress.  HENT:  Head: Normocephalic and atraumatic.  Right Ear: External ear normal.  Left Ear: External ear normal.  Nose: Nose normal.  Mouth/Throat: Oropharynx is clear and moist. No oropharyngeal exudate.  Eyes: Conjunctivae and EOM are normal. Pupils are equal, round, and reactive to light. Right eye exhibits no discharge.  Neck: Normal range of motion. Neck supple. No thyromegaly present.  Cardiovascular: Normal rate, regular rhythm, normal heart sounds and intact distal pulses.  Exam reveals no gallop and no friction rub.   No murmur heard. Pulmonary/Chest:  Effort normal. No respiratory distress. She has no wheezes. She has no rales.  Abdominal: Soft. Bowel sounds are normal. She exhibits no distension and no mass. There is no tenderness. There is no rebound and no guarding.  Musculoskeletal: Normal range of motion. She exhibits no edema or tenderness.  Lymphadenopathy:    She has no cervical adenopathy.  Neurological: She is alert and oriented to person, place, and time. No cranial nerve deficit. Coordination normal.  Skin: Skin is warm and dry. No rash noted. She is not diaphoretic. No erythema. No pallor.  Psychiatric: She has a normal mood and affect. Her behavior is normal. Judgment and thought content normal.          Assessment & Plan:   Problem List Items Addressed This Visit      Unprioritized   Routine general medical examination at a health care facility - Primary    General medical exam normal today. Pelvic and breast exam deferred as scheduled with her OB next week. Encouraged healthy diet and exercise. Flu vaccine today. Will check basic labs including CBC, CMP, lipids next month fasting.    Relevant Orders      CBC with Differential      Lipid panel      Comprehensive metabolic panel      Microalbumin / creatinine urine ratio      Vit D  25 hydroxy (rtn osteoporosis monitoring)    Other Visit Diagnoses    Special screening for malignant neoplasms, colon        Relevant Orders       Ambulatory referral to Gastroenterology  Return in about 1 year (around 10/01/2015) for Physical.

## 2014-11-08 ENCOUNTER — Ambulatory Visit (INDEPENDENT_AMBULATORY_CARE_PROVIDER_SITE_OTHER): Payer: BC Managed Care – PPO | Admitting: Endocrinology

## 2014-11-08 ENCOUNTER — Other Ambulatory Visit: Payer: Self-pay | Admitting: *Deleted

## 2014-11-08 ENCOUNTER — Encounter: Payer: Self-pay | Admitting: Endocrinology

## 2014-11-08 VITALS — BP 122/62 | HR 84 | Ht 64.5 in | Wt 131.2 lb

## 2014-11-08 DIAGNOSIS — Z0000 Encounter for general adult medical examination without abnormal findings: Secondary | ICD-10-CM

## 2014-11-08 DIAGNOSIS — R7989 Other specified abnormal findings of blood chemistry: Secondary | ICD-10-CM

## 2014-11-08 DIAGNOSIS — R894 Abnormal immunological findings in specimens from other organs, systems and tissues: Secondary | ICD-10-CM

## 2014-11-08 DIAGNOSIS — R768 Other specified abnormal immunological findings in serum: Secondary | ICD-10-CM

## 2014-11-08 DIAGNOSIS — R946 Abnormal results of thyroid function studies: Secondary | ICD-10-CM

## 2014-11-08 NOTE — Progress Notes (Signed)
Pre visit review using our clinic review tool, if applicable. No additional management support is needed unless otherwise documented below in the visit note. 

## 2014-11-08 NOTE — Assessment & Plan Note (Signed)
She appears to be clinically euthyroid at this time with a small goiter without compressive symptoms. Will check thyroid labs at today's visit, to assess trend in thyroid function.  She may or may not develop thyroid dysfunction in the future.  Will pursue further workup/treatment if she does.  If Thyroid tests are slightly abnormal at this time, would continue to monitor symptoms. Heart rate well controlled at today's visit.  RTC 6 months

## 2014-11-08 NOTE — Patient Instructions (Signed)
Watch for symptoms of hyperthyroidism.   Labs today.   RTC 6 months

## 2014-11-08 NOTE — Progress Notes (Signed)
Patient ID: Leslie Ruiz, female   DOB: 05/14/1966, 48 y.o.   MRN: [id number]   Reason for visit- Follow up of High TPO levels, low TSH levels HPI  Leslie Ruiz is a 48 y.o.-year-old female, for follow up for elevated TPO Ab levels. Last visit with me was ~Sept 2015.   The patient denies any prior personal history of thyroid problems. She has family history of hyperthyroidism in her mother who required a short course of treatment and is not currently on any thyroid hormone. Sister has hypothyroidism. She denies  any other autoimmune problems in her family. Sister and she have gluten sensitivities.   Patient denies any recent hospitalization, illness or major change in his medical history. No recent medication changes. Denies tenderness in neck area.  Denies any recent ingestion of seaweed/kelp or recent exposure to iv contrast dye. No recent steroid use or use of herbal supplements.   In early July, she developed an episode of dizziness that was varying in intensity from day to day and lasted for about 1-1.5 weeks. It was followed by a cold and she had a day of sore throat then. Dizziness has resolved and not yet recovered since then. No recurrence of dizziness. TPO and thyroid levels were checked as part of that workup.   Patient is on continuous birth control for regulation of menses and contraception and plans to continue it till about age 48 per her GYN.      I reviewed pt's thyroid tests: Lab Results  Component Value Date   TSH 0.270* 08/02/2014   TSH 0.36 06/01/2014   TSH 1.34 09/30/2013   TSH 0.95 09/25/2012   FREET4 0.82 08/02/2014   FREET4 0.86 06/01/2014    Lab Results  Component Value Date   T3FREE 3.3 08/02/2014    Other pertinent labs are as follows: 06/01/14- Total T3  Of 185.6, TPO  184, TG AB <20 07/2014- TSI elevated at 165  Review of systems:  [ x ] complains of   [  ] denies  [  ] weight loss  [  ] tremors [ x ] palpitations-occasional feels skipped beats [   ] diarrhea [  ] increased anxiety [  ] muscle weakness [  ] heat intolerance [  ] fatigue  [  ] proptosis  [  ] noticing any enlargement in size of thyroid [  ] lumps in neck [  ] dysphagia  [  ] change in voice [ x  ] frequent clearing of throat  I have reviewed the patient's past medical history, medications and allergies.   Current Outpatient Prescriptions on File Prior to Visit  Medication Sig Dispense Refill  . fluocinonide cream (LIDEX) 0.05 %   0  . levonorgestrel-ethinyl estradiol (AVIANE) 0.1-20 MG-MCG tablet Take 1 tablet by mouth daily. 1 Package 11  . PREMARIN vaginal cream   0   No current facility-administered medications on file prior to visit.   Allergies  Allergen Reactions  . Gluten Meal      PE: BP 122/62 mmHg  Pulse 84  Ht 5' 4.5" (1.638 m)  Wt 131 lb 4 oz (59.535 kg)  BMI 22.19 kg/m2  SpO2 98% Wt Readings from Last 3 Encounters:  11/08/14 131 lb 4 oz (59.535 kg)  09/30/14 132 lb 4 oz (59.988 kg)  08/02/14 128 lb 12 oz (58.401 kg)    HEENT: [place]/AT, EOMI, no icterus, no proptosis, no chemosis, no mild lid lag, no retraction, eyes close completely  Neck: thyroid gland - smooth, slight fullness, non-tender, no erythema, no tracheal deviation; negative Pemberton's sign; no lympahadenopathy; no bruits Lungs: good air entry, clear bilaterally Heart: S1&S2 normal, regular rate & rhythm; no murmurs, rubs or gallops Ext: no tremor in hands bilaterally, no edema, 2+ DP/PT pulses, good muscle mass Neuro: normal gait, 2+ reflexes bilaterally, normal 5/5 strength, no proximal myopathy  Derm: no pretibial myxoedema/skin dryness   ASSESSMENT AND PLAN: 1. Anti-TPO AB present 2. Low TSH level  Problem List Items Addressed This Visit      Other   Anti-TPO antibodies present - Primary    She appears to be clinically euthyroid at this time with a small goiter without compressive symptoms. Will check thyroid labs at today's visit, to assess trend in thyroid function.   She may or may not develop thyroid dysfunction in the future.  Will pursue further workup/treatment if she does.  If Thyroid tests are slightly abnormal at this time, would continue to monitor symptoms. Heart rate well controlled at today's visit.  RTC 6 months       Other Visit Diagnoses    Low TSH level          Low TSH levels- As above.   [facility]waine Pringle [facility] 11/08/2014  2:13 PM

## 2014-11-09 ENCOUNTER — Other Ambulatory Visit (INDEPENDENT_AMBULATORY_CARE_PROVIDER_SITE_OTHER): Payer: BC Managed Care – PPO

## 2014-11-09 DIAGNOSIS — Z0000 Encounter for general adult medical examination without abnormal findings: Secondary | ICD-10-CM

## 2014-11-09 LAB — COMPREHENSIVE METABOLIC PANEL
ALT: 18 U/L (ref 0–35)
AST: 24 U/L (ref 0–37)
Albumin: 4.4 g/dL (ref 3.5–5.2)
Alkaline Phosphatase: 75 U/L (ref 39–117)
BUN: 14 mg/dL (ref 6–23)
CHLORIDE: 109 meq/L (ref 96–112)
CO2: 24 mEq/L (ref 19–32)
CREATININE: 0.8 mg/dL (ref 0.4–1.2)
Calcium: 9.6 mg/dL (ref 8.4–10.5)
GFR: 76.14 mL/min (ref >60.00)
Glucose, Bld: 88 mg/dL (ref 70–99)
Potassium: 4.5 mEq/L (ref 3.5–5.1)
SODIUM: 145 meq/L (ref 135–145)
TOTAL PROTEIN: 7.8 g/dL (ref 6.0–8.3)
Total Bilirubin: 0.4 mg/dL (ref 0.2–1.2)

## 2014-11-09 LAB — LIPID PANEL
CHOL/HDL RATIO: 4
Cholesterol: 207 mg/dL — ABNORMAL HIGH (ref 0–200)
HDL: 50.8 mg/dL (ref >39.00)
LDL Cholesterol: 126 mg/dL — ABNORMAL HIGH (ref 0–99)
NONHDL: 156.2
Triglycerides: 153 mg/dL — ABNORMAL HIGH (ref 0.0–149.0)
VLDL: 30.6 mg/dL (ref 0.0–40.0)

## 2014-11-09 LAB — CBC WITH DIFFERENTIAL/PLATELET
BASOS PCT: 1.3 % (ref 0.0–3.0)
Basophils Absolute: 0.1 10*3/uL (ref 0.0–0.1)
EOS ABS: 0.1 10*3/uL (ref 0.0–0.7)
Eosinophils Relative: 2.6 % (ref 0.0–5.0)
HCT: 42.3 % (ref 36.0–46.0)
HEMOGLOBIN: 13.9 g/dL (ref 12.0–15.0)
LYMPHS ABS: 1.3 10*3/uL (ref 0.7–4.0)
Lymphocytes Relative: 24.2 % (ref 12.0–46.0)
MCHC: 32.8 g/dL (ref 30.0–36.0)
MCV: 86.9 fl (ref 78.0–100.0)
MONO ABS: 0.3 10*3/uL (ref 0.1–1.0)
Monocytes Relative: 5 % (ref 3.0–12.0)
NEUTROS ABS: 3.7 10*3/uL (ref 1.4–7.7)
Neutrophils Relative %: 66.9 % (ref 43.0–77.0)
Platelets: 286 10*3/uL (ref 150.0–400.0)
RBC: 4.86 Mil/uL (ref 3.87–5.11)
RDW: 13.3 % (ref 11.5–15.5)
WBC: 5.6 10*3/uL (ref 4.0–10.5)

## 2014-11-09 LAB — T3, FREE: T3, Free: 4.3 pg/mL — ABNORMAL HIGH (ref 2.3–4.2)

## 2014-11-09 LAB — T4, FREE: Free T4: 0.93 ng/dL (ref 0.60–1.60)

## 2014-11-09 LAB — VITAMIN D 25 HYDROXY (VIT D DEFICIENCY, FRACTURES): VITD: 32.32 ng/mL (ref 30.00–100.00)

## 2014-11-09 LAB — MICROALBUMIN / CREATININE URINE RATIO
CREATININE, U: 138.1 mg/dL
MICROALB/CREAT RATIO: 0.3 mg/g (ref 0.0–30.0)
Microalb, Ur: 0.4 mg/dL (ref 0.0–1.9)

## 2014-11-10 LAB — THYROID PROFILE II
Free Thyroxine Index: 2.2 (ref 1.2–4.9)
T3 TOTAL: 168 ng/dL (ref 71–180)
T3 Uptake Ratio: 24 % (ref 24–39)
T4 TOTAL: 9.3 ug/dL (ref 4.5–12.0)
TSH: 0.932 u[IU]/mL (ref 0.450–4.500)

## 2014-12-05 ENCOUNTER — Encounter: Payer: Self-pay | Admitting: Endocrinology

## 2014-12-19 ENCOUNTER — Encounter: Payer: Self-pay | Admitting: Internal Medicine

## 2015-01-13 ENCOUNTER — Ambulatory Visit: Payer: Self-pay | Admitting: Unknown Physician Specialty

## 2015-01-18 ENCOUNTER — Ambulatory Visit: Payer: Self-pay | Admitting: Obstetrics and Gynecology

## 2015-03-13 ENCOUNTER — Encounter: Payer: Self-pay | Admitting: Internal Medicine

## 2015-05-09 ENCOUNTER — Ambulatory Visit (INDEPENDENT_AMBULATORY_CARE_PROVIDER_SITE_OTHER): Payer: BLUE CROSS/BLUE SHIELD | Admitting: Endocrinology

## 2015-05-09 ENCOUNTER — Encounter: Payer: Self-pay | Admitting: Endocrinology

## 2015-05-09 VITALS — BP 106/60 | HR 84 | Resp 12 | Ht 64.5 in | Wt 138.8 lb

## 2015-05-09 DIAGNOSIS — R768 Other specified abnormal immunological findings in serum: Secondary | ICD-10-CM

## 2015-05-09 DIAGNOSIS — R894 Abnormal immunological findings in specimens from other organs, systems and tissues: Secondary | ICD-10-CM | POA: Diagnosis not present

## 2015-05-09 NOTE — Patient Instructions (Signed)
Labs today. Monitor symptoms for unintentional weight loss, unexplained ongoing diarrhea, tremors, palpitations and feeling hot all the time.  Report back promptly if they occur.  Can follow back with Dr Dan Humphreys.  Please come back for a follow-up appointment in 6 months

## 2015-05-09 NOTE — Assessment & Plan Note (Signed)
She appears to be clinically euthyroid at this time with a small goiter without compressive symptoms. Will check thyroid labs at today's visit, to assess trend in thyroid function.  She may or may not develop thyroid dysfunction in the future.  Will pursue further workup/treatment if she does.  If Thyroid tests are slightly abnormal at this time, would continue to monitor symptoms. Heart rate well controlled at today's visit.

## 2015-05-09 NOTE — Progress Notes (Signed)
Reason for visit- Follow up of High TPO levels, low TSH levels HPI  Leslie Ruiz is a 49 y.o.-year-old female, for follow up for elevated TPO Ab levels. Last visit with me was December 2015.   The patient denies any prior personal history of thyroid problems. She has family history of hyperthyroidism in her mother who required a short course of treatment and is not currently on any thyroid hormone. Sister has hypothyroidism. She denies  any other autoimmune problems in her family. Sister and she have gluten sensitivities.   Patient denies any recent hospitalization, illness or major change in his medical history. No recent medication changes. Denies tenderness in neck area.  Denies any recent ingestion of seaweed/kelp or recent exposure to iv contrast dye. No recent steroid use or use of herbal supplements.   In early July, she developed an episode of dizziness that was varying in intensity from day to day and lasted for about 1-1.5 weeks. It was followed by a cold and she had a day of sore throat then. Dizziness has resolved and not yet recovered since then. No recurrence of dizziness. TPO and thyroid levels were checked as part of that workup.   Patient is on continuous birth control for regulation of menses and contraception and plans to continue it till about age 36 per her GYN.   Last thyroid levels were normal except for mildy elevated free T3.  I reviewed pt's thyroid tests: Lab Results  Component Value Date   TSH 0.932 11/09/2014   TSH 0.270* 08/02/2014   TSH 0.36 06/01/2014   TSH 1.34 09/30/2013   TSH 0.95 09/25/2012   FREET4 0.93 11/09/2014   FREET4 0.82 08/02/2014   FREET4 0.86 06/01/2014    Lab Results  Component Value Date   T3FREE 4.3* 11/09/2014   T3FREE 3.3 08/02/2014    Other pertinent labs are as follows: 06/01/14- Total T3  Of 185.6, TPO  184, TG AB <20 07/2014- TSI elevated at 165  Review of systems:  [ x ] complains of   [  ] denies  [  ] weight loss  [  ]  tremors [  ] palpitations [  ] diarrhea [  ] increased anxiety [  ] muscle weakness [ x ] heat and cold intolerance [  x] fatigue  [  ] proptosis  [  ] noticing any enlargement in size of thyroid [  ] lumps in neck [  ] dysphagia  [  ] change in voice [ x  ] frequent clearing of throat Slight insomnia.  Rejoined the gym 2-3 weeks ago, but energy levels are still on lower side  Vitamin D checked 6 months ago was normal; taking daily MVI   I have reviewed the patient's past medical history, medications and allergies.   Current Outpatient Prescriptions on File Prior to Visit  Medication Sig Dispense Refill  . diphenhydrAMINE (BENADRYL ALLERGY) 25 MG tablet Take 25 mg by mouth at bedtime as needed.    Marland Kitchen levonorgestrel-ethinyl estradiol (AVIANE) 0.1-20 MG-MCG tablet Take 1 tablet by mouth daily. 1 Package 11  . PREMARIN vaginal cream   0   No current facility-administered medications on file prior to visit.   Allergies  Allergen Reactions  . Gluten Meal      PE: BP 106/60 mmHg  Pulse 84  Resp 12  Ht 5' 4.5" (1.638 m)  Wt 138 lb 12 oz (62.937 kg)  BMI 23.46 kg/m2  SpO2 98% Wt Readings from Last  3 Encounters:  05/09/15 138 lb 12 oz (62.937 kg)  11/08/14 131 lb 4 oz (59.535 kg)  09/30/14 132 lb 4 oz (59.988 kg)    HEENT: [place]/AT, EOMI, no icterus, no proptosis, no chemosis, no mild lid lag, no retraction, eyes close completely Neck: thyroid gland - smooth, slight fullness, non-tender, no erythema, no tracheal deviation; negative Pemberton's sign; no lympahadenopathy; no bruits Lungs: good air entry, clear bilaterally Heart: S1&S2 normal, regular rate & rhythm; no murmurs, rubs or gallops Ext: no tremor in hands bilaterally, no edema, 2+ DP/PT pulses, good muscle mass Neuro: normal gait, 2+ reflexes bilaterally, normal 5/5 strength, no proximal myopathy  Derm: no pretibial myxoedema/skin dryness   ASSESSMENT AND PLAN: 1. Anti-TPO AB present 2. Low TSH level  Problem List  Items Addressed This Visit      Other   Anti-TPO antibodies present - Primary    She appears to be clinically euthyroid at this time with a small goiter without compressive symptoms. Will check thyroid labs at today's visit, to assess trend in thyroid function.  She may or may not develop thyroid dysfunction in the future.  Will pursue further workup/treatment if she does.  If Thyroid tests are slightly abnormal at this time, would continue to monitor symptoms. Heart rate well controlled at today's visit.            Relevant Orders   T4, free   T3, free   Thyroid Profile II     RTC 6 months. Explained that [place] am [place] of [place], and discussed follow up care. Will schedule follow back with Dr Dan Humphreys.   Leslie Ruiz [facility] 05/09/2015  2:02 PM

## 2015-05-10 LAB — T3, FREE: T3, Free: 3.5 pg/mL (ref 2.3–4.2)

## 2015-05-10 LAB — T4, FREE: Free T4: 0.74 ng/dL (ref 0.60–1.60)

## 2015-05-10 LAB — THYROID PROFILE II
FREE THYROXINE INDEX: 2.2 (ref 1.2–4.9)
T3 UPTAKE RATIO: 25 % (ref 24–39)
T3, Total: 143 ng/dL (ref 71–180)
T4, Total: 8.7 ug/dL (ref 4.5–12.0)
TSH: 1.86 u[IU]/mL (ref 0.450–4.500)

## 2015-11-09 ENCOUNTER — Ambulatory Visit: Payer: BLUE CROSS/BLUE SHIELD | Admitting: Internal Medicine

## 2015-11-13 ENCOUNTER — Ambulatory Visit (INDEPENDENT_AMBULATORY_CARE_PROVIDER_SITE_OTHER): Payer: BLUE CROSS/BLUE SHIELD | Admitting: Internal Medicine

## 2015-11-13 ENCOUNTER — Encounter: Payer: Self-pay | Admitting: Internal Medicine

## 2015-11-13 VITALS — BP 120/80 | HR 87 | Temp 98.2°F | Resp 18 | Ht 64.5 in | Wt 141.0 lb

## 2015-11-13 DIAGNOSIS — Z23 Encounter for immunization: Secondary | ICD-10-CM

## 2015-11-13 DIAGNOSIS — R768 Other specified abnormal immunological findings in serum: Secondary | ICD-10-CM

## 2015-11-13 DIAGNOSIS — F4323 Adjustment disorder with mixed anxiety and depressed mood: Secondary | ICD-10-CM

## 2015-11-13 DIAGNOSIS — R7689 Other specified abnormal immunological findings in serum: Secondary | ICD-10-CM

## 2015-11-13 DIAGNOSIS — R002 Palpitations: Secondary | ICD-10-CM | POA: Diagnosis not present

## 2015-11-13 DIAGNOSIS — R894 Abnormal immunological findings in specimens from other organs, systems and tissues: Secondary | ICD-10-CM

## 2015-11-13 LAB — T4, FREE: Free T4: 0.7 ng/dL (ref 0.60–1.60)

## 2015-11-13 LAB — CBC WITH DIFFERENTIAL/PLATELET
BASOS PCT: 0.6 % (ref 0.0–3.0)
Basophils Absolute: 0 10*3/uL (ref 0.0–0.1)
EOS ABS: 0.2 10*3/uL (ref 0.0–0.7)
EOS PCT: 3.1 % (ref 0.0–5.0)
HEMATOCRIT: 38.8 % (ref 36.0–46.0)
Hemoglobin: 12.9 g/dL (ref 12.0–15.0)
LYMPHS PCT: 24.5 % (ref 12.0–46.0)
Lymphs Abs: 1.3 10*3/uL (ref 0.7–4.0)
MCHC: 33.2 g/dL (ref 30.0–36.0)
MCV: 88.1 fl (ref 78.0–100.0)
Monocytes Absolute: 0.3 10*3/uL (ref 0.1–1.0)
Monocytes Relative: 5.2 % (ref 3.0–12.0)
NEUTROS ABS: 3.7 10*3/uL (ref 1.4–7.7)
Neutrophils Relative %: 66.6 % (ref 43.0–77.0)
PLATELETS: 282 10*3/uL (ref 150.0–400.0)
RBC: 4.41 Mil/uL (ref 3.87–5.11)
RDW: 13.5 % (ref 11.5–15.5)
WBC: 5.5 10*3/uL (ref 4.0–10.5)

## 2015-11-13 LAB — T3, FREE: T3, Free: 3.9 pg/mL (ref 2.3–4.2)

## 2015-11-13 NOTE — Patient Instructions (Signed)
Labs today.  Set goal of exercise daily.  Call or email with any questions or concerns.

## 2015-11-13 NOTE — Assessment & Plan Note (Signed)
Repeat thyroid function ordered. Reviewed notes from Dr. Welford RochePhadke.

## 2015-11-13 NOTE — Progress Notes (Signed)
Pre-visit discussion using our clinic review tool. No additional management support is needed unless otherwise documented below in the visit note.  

## 2015-11-13 NOTE — Assessment & Plan Note (Signed)
Recent episodes of palpitations. EKG today shows NSR. Exam normal. Will check thyroid function, CBC, CMP. Follow up in 4 weeks or sooner if symptoms persistent. If persistent, consider cardiology evaluation.

## 2015-11-13 NOTE — Progress Notes (Signed)
Subjective:    Patient ID: Leslie Ruiz, female    DOB: 1966-10-26, 49 y.o.   MRN: [id number]  HPI  49YO female presents for follow up.  Palpitations - Occur mostly at night a few times per week. Feels like fluttering. Sometimes wakes at night. No change in diet or medication. Only having one cup of 1/2 caff coffee in morning. No chest pain or dyspnea. Physically active. Exercises at gym. No dyspnea or chest pain with exercise.  Stressful time for her. Mother diagnosed with dementia. Her child is leaving for college. She continues to work full time. Feels "melancholy" at times. Some increased anxiety and irritability. Attributes symptoms to stressors and menopause. Not taking any medication for this.   Wt Readings from Last 3 Encounters:  11/13/15 141 lb (63.957 kg)  05/09/15 138 lb 12 oz (62.937 kg)  11/08/14 131 lb 4 oz (59.535 kg)   BP Readings from Last 3 Encounters:  11/13/15 120/80  05/09/15 106/60  11/08/14 122/62    Past Medical History  Diagnosis Date  . Gluten intolerance    Family History  Problem Relation Age of Onset  . Arthritis Mother   . Cancer Mother     Lung  . Hypertension Mother   . Kidney disease Mother   . COPD Mother   . Arthritis Father   . Heart disease Father   . COPD Father   . Heart disease Paternal Grandmother   . Hypertension Paternal Grandmother   . Heart disease Paternal Grandfather   . Hypertension Paternal Grandfather   . Celiac disease Sister   . Kidney disease Sister   . Kidney disease Maternal Uncle 30   Past Surgical History  Procedure Laterality Date  . Cesarean section     Social History   Social History  . Marital Status: Married    Spouse Name: N/A  . Number of Children: N/A  . Years of Education: N/A   Social History Main Topics  . Smoking status: Never Smoker   . Smokeless tobacco: Never Used  . Alcohol Use: 0.0 oz/week    0 Standard drinks or equivalent per week  . Drug Use: No  . Sexual Activity:  Not Asked   Other Topics Concern  . None   Social History Narrative   Lives in [place] with husband and daughter 50. Cat and dog in home.      Diet - gluten free   Exercise - horseback riding, walking      Work - home, [employer]    Review of Systems  Constitutional: Negative for fever, chills, appetite change, fatigue and unexpected weight change.  Eyes: Negative for visual disturbance.  Respiratory: Negative for shortness of breath.   Cardiovascular: Negative for chest pain and leg swelling.  Gastrointestinal: Negative for nausea, vomiting, abdominal pain, diarrhea and constipation.  Musculoskeletal: Negative for myalgias and arthralgias.  Skin: Negative for color change and rash.  Hematological: Negative for adenopathy. Does not bruise/bleed easily.  Psychiatric/Behavioral: Positive for sleep disturbance and dysphoric mood. The patient is nervous/anxious.        Objective:    BP 120/80 mmHg  Pulse 87  Temp(Src) 98.2 F (36.8 C) (Oral)  Resp 18  Ht 5' 4.5" (1.638 m)  Wt 141 lb (63.957 kg)  BMI 23.84 kg/m2  SpO2 97% Physical Exam  Constitutional: She is oriented to person, place, and time. She appears well-developed and well-nourished. No distress.  HENT:  Head: Normocephalic and atraumatic.  Right Ear: External  ear normal.  Left Ear: External ear normal.  Nose: Nose normal.  Mouth/Throat: Oropharynx is clear and moist. No oropharyngeal exudate.  Eyes: Conjunctivae are normal. Pupils are equal, round, and reactive to light. Right eye exhibits no discharge. Left eye exhibits no discharge. No scleral icterus.  Neck: Normal range of motion. Neck supple. Carotid bruit is not present. No tracheal deviation present. Thyromegaly present.  Cardiovascular: Normal rate, regular rhythm, normal heart sounds and intact distal pulses.  Exam reveals no gallop and no friction rub.   No murmur heard. Pulmonary/Chest: Effort normal and breath sounds normal. No respiratory distress.  She has no wheezes. She has no rales. She exhibits no tenderness.  Musculoskeletal: Normal range of motion. She exhibits no edema or tenderness.  Lymphadenopathy:    She has no cervical adenopathy.  Neurological: She is alert and oriented to person, place, and time. No cranial nerve deficit. She exhibits normal muscle tone. Coordination normal.  Skin: Skin is warm and dry. No rash noted. She is not diaphoretic. No erythema. No pallor.  Psychiatric: She has a normal mood and affect. Her speech is normal and behavior is normal. Judgment and thought content normal. Her mood appears not anxious. She does not exhibit a depressed mood. She expresses no suicidal ideation.          Assessment & Plan:   Problem List Items Addressed This Visit      Unprioritized   Adjustment disorder with mixed anxiety and depressed mood    Recent "melancholy" mood. Discussed common symptoms in menopause. Discussed some natural ways to improve anxiety and depression, including taking time for herself and exercise. Discussed potentially starting SSRI such as Paxil or Prozac. She prefers to hold off for now. Follow up 4 weeks and prn.      Anti-TPO antibodies present    Repeat thyroid function ordered. Reviewed notes from Dr. Welford RochePhadke.      Relevant Orders   Thyroid Profile II   T3, free   T4, free   Palpitations - Primary    Recent episodes of palpitations. EKG today shows NSR. Exam normal. Will check thyroid function, CBC, CMP. Follow up in 4 weeks or sooner if symptoms persistent. If persistent, consider cardiology evaluation.      Relevant Orders   CBC w/Diff   Comprehensive metabolic panel   EKG 12-Lead (Completed)       Return in about 4 weeks (around 12/11/2015) for Recheck.

## 2015-11-13 NOTE — Assessment & Plan Note (Signed)
Recent "melancholy" mood. Discussed common symptoms in menopause. Discussed some natural ways to improve anxiety and depression, including taking time for herself and exercise. Discussed potentially starting SSRI such as Paxil or Prozac. She prefers to hold off for now. Follow up 4 weeks and prn.

## 2015-11-14 LAB — THYROID PROFILE II
Free Thyroxine Index: 2.2 (ref 1.2–4.9)
T3 UPTAKE RATIO: 24 % (ref 24–39)
T3, Total: 139 ng/dL (ref 71–180)
T4 TOTAL: 9 ug/dL (ref 4.5–12.0)
TSH: 2.63 u[IU]/mL (ref 0.450–4.500)

## 2015-11-21 LAB — COMPREHENSIVE METABOLIC PANEL
ALT: 13 U/L (ref 0–35)
AST: 17 U/L (ref 0–37)
Albumin: 4 g/dL (ref 3.5–5.2)
Alkaline Phosphatase: 76 U/L (ref 39–117)
BUN: 11 mg/dL (ref 6–23)
CO2: 29 meq/L (ref 19–32)
CREATININE: 0.75 mg/dL (ref 0.40–1.20)
Calcium: 9.4 mg/dL (ref 8.4–10.5)
Chloride: 101 mEq/L (ref 96–112)
GFR: 86.43 mL/min (ref >60.00)
Glucose, Bld: 113 mg/dL — ABNORMAL HIGH (ref 70–99)
Potassium: 4.2 mEq/L (ref 3.5–5.1)
Sodium: 140 mEq/L (ref 135–145)
Total Bilirubin: 0.2 mg/dL (ref 0.2–1.2)
Total Protein: 7 g/dL (ref 6.0–8.3)

## 2015-12-13 ENCOUNTER — Ambulatory Visit (INDEPENDENT_AMBULATORY_CARE_PROVIDER_SITE_OTHER): Payer: BLUE CROSS/BLUE SHIELD | Admitting: Internal Medicine

## 2015-12-13 ENCOUNTER — Encounter: Payer: Self-pay | Admitting: Internal Medicine

## 2015-12-13 VITALS — BP 137/74 | HR 79 | Temp 98.3°F | Resp 18 | Ht 64.5 in | Wt 142.0 lb

## 2015-12-13 DIAGNOSIS — R002 Palpitations: Secondary | ICD-10-CM

## 2015-12-13 DIAGNOSIS — F4323 Adjustment disorder with mixed anxiety and depressed mood: Secondary | ICD-10-CM | POA: Diagnosis not present

## 2015-12-13 MED ORDER — FLUOXETINE HCL 20 MG PO TABS: 20.0000 mg | ORAL_TABLET | Freq: Every day | ORAL | Status: DC

## 2015-12-13 NOTE — Progress Notes (Signed)
Pre-visit discussion using our clinic review tool. No additional management support is needed unless otherwise documented below in the visit note.  

## 2015-12-13 NOTE — Patient Instructions (Signed)
Start Fluoxetine  daily in the morning.  Follow up 4 weeks.

## 2015-12-13 NOTE — Assessment & Plan Note (Signed)
Symptoms improved with increased exercise, however would like to try SSRI to help with anxiety and hot flashes. Will start Fluoxetine  daily. Follow up in 4 weeks and prn.

## 2015-12-13 NOTE — Progress Notes (Signed)
Subjective:    Patient ID: Leslie Ruiz, female    DOB: 1966-07-17, 50 y.o.   MRN: [id number]  HPI 50YO female presents for follow up.  Seen in 10/2015 for palpitations and depressed mood.  Feeling better in regards to mood. Exercising more.  Continues to have episodes of waking with heart pounding.Resolves with rest after a few minutes. Occurs almost every night. Occasionally during the day. No palpitations during exercise. No chest pain. No dyspnea.   Wt Readings from Last 3 Encounters:  12/13/15 142 lb (64.411 kg)  11/13/15 141 lb (63.957 kg)  05/09/15 138 lb 12 oz (62.937 kg)   BP Readings from Last 3 Encounters:  12/13/15 137/74  11/13/15 120/80  05/09/15 106/60    Past Medical History  Diagnosis Date  . Gluten intolerance    Family History  Problem Relation Age of Onset  . Arthritis Mother   . Cancer Mother     Lung  . Hypertension Mother   . Kidney disease Mother   . COPD Mother   . Arthritis Father   . Heart disease Father   . COPD Father   . Heart disease Paternal Grandmother   . Hypertension Paternal Grandmother   . Heart disease Paternal Grandfather   . Hypertension Paternal Grandfather   . Celiac disease Sister   . Kidney disease Sister   . Kidney disease Maternal Uncle 30   Past Surgical History  Procedure Laterality Date  . Cesarean section     Social History   Social History  . Marital Status: Married    Spouse Name: N/A  . Number of Children: N/A  . Years of Education: N/A   Social History Main Topics  . Smoking status: Never Smoker   . Smokeless tobacco: Never Used  . Alcohol Use: 0.0 oz/week    0 Standard drinks or equivalent per week  . Drug Use: No  . Sexual Activity: Not Asked   Other Topics Concern  . None   Social History Narrative   Lives in [place] with husband and daughter 71. Cat and dog in home.      Diet - gluten free   Exercise - horseback riding, walking      Work - home, [employer]    Review of  Systems  Constitutional: Positive for diaphoresis. Negative for fever, chills, appetite change, fatigue and unexpected weight change.  Eyes: Negative for visual disturbance.  Respiratory: Negative for shortness of breath.   Cardiovascular: Positive for palpitations. Negative for chest pain and leg swelling.  Gastrointestinal: Negative for abdominal pain.  Endocrine: Positive for heat intolerance.  Skin: Negative for color change and rash.  Hematological: Negative for adenopathy. Does not bruise/bleed easily.  Psychiatric/Behavioral: Positive for sleep disturbance. Negative for dysphoric mood. The patient is not nervous/anxious.        Objective:    BP 137/74 mmHg  Pulse 79  Temp(Src) 98.3 F (36.8 C) (Oral)  Resp 18  Ht 5' 4.5" (1.638 m)  Wt 142 lb (64.411 kg)  BMI 24.01 kg/m2  SpO2 100% Physical Exam  Constitutional: She is oriented to person, place, and time. She appears well-developed and well-nourished. No distress.  HENT:  Head: Normocephalic and atraumatic.  Right Ear: External ear normal.  Left Ear: External ear normal.  Nose: Nose normal.  Mouth/Throat: Oropharynx is clear and moist.  Eyes: Conjunctivae are normal. Pupils are equal, round, and reactive to light. Right eye exhibits no discharge. Left eye exhibits no discharge. No scleral  icterus.  Neck: Normal range of motion. Neck supple. No tracheal deviation present. No thyromegaly present.  Cardiovascular: Normal rate, regular rhythm, normal heart sounds and intact distal pulses.  Exam reveals no gallop and no friction rub.   No murmur heard. Pulmonary/Chest: Effort normal and breath sounds normal. No respiratory distress. She has no wheezes. She has no rales. She exhibits no tenderness.  Musculoskeletal: Normal range of motion. She exhibits no edema or tenderness.  Lymphadenopathy:    She has no cervical adenopathy.  Neurological: She is alert and oriented to person, place, and time. No cranial nerve deficit. She  exhibits normal muscle tone. Coordination normal.  Skin: Skin is warm and dry. No rash noted. She is not diaphoretic. No erythema. No pallor.  Psychiatric: She has a normal mood and affect. Her behavior is normal. Judgment and thought content normal.          Assessment & Plan:   Problem List Items Addressed This Visit      Unprioritized   Adjustment disorder with mixed anxiety and depressed mood    Symptoms improved with increased exercise, however would like to try SSRI to help with anxiety and hot flashes. Will start Fluoxetine  daily. Follow up in 4 weeks and prn.      Palpitations - Primary    Persistent nightly palpitations what wake her from sleep. Recommended further evaluation with cardiology for possible Holter monitor. Also recommended sleep study, as question if apnea waking her from sleep.  Recent CBC, CMP and thyroid function were normal. Also starting SSRI to help with anxiety. Follow up by email in 2 weeks and after cardiology evaluation.      Relevant Orders   Ambulatory referral to Cardiology       Return in about 4 weeks (around 01/10/2016) for Recheck.

## 2015-12-13 NOTE — Assessment & Plan Note (Signed)
Persistent nightly palpitations what wake her from sleep. Recommended further evaluation with cardiology for possible Holter monitor. Also recommended sleep study, as question if apnea waking her from sleep.  Recent CBC, CMP and thyroid function were normal. Also starting SSRI to help with anxiety. Follow up by email in 2 weeks and after cardiology evaluation.

## 2015-12-21 ENCOUNTER — Encounter: Payer: Self-pay | Admitting: Internal Medicine

## 2016-01-04 ENCOUNTER — Encounter: Payer: Self-pay | Admitting: Internal Medicine

## 2016-02-05 ENCOUNTER — Other Ambulatory Visit: Payer: Self-pay | Admitting: Obstetrics and Gynecology

## 2016-02-05 DIAGNOSIS — Z1231 Encounter for screening mammogram for malignant neoplasm of breast: Secondary | ICD-10-CM

## 2016-02-05 LAB — HM PAP SMEAR

## 2016-02-06 ENCOUNTER — Ambulatory Visit: Payer: BLUE CROSS/BLUE SHIELD | Admitting: Cardiovascular Disease

## 2016-03-07 ENCOUNTER — Ambulatory Visit: Admission: RE | Admit: 2016-03-07 | Payer: BLUE CROSS/BLUE SHIELD | Source: Ambulatory Visit

## 2016-03-08 ENCOUNTER — Ambulatory Visit: Payer: BLUE CROSS/BLUE SHIELD | Admitting: Cardiovascular Disease

## 2016-03-11 ENCOUNTER — Ambulatory Visit
Admission: RE | Admit: 2016-03-11 | Discharge: 2016-03-11 | Disposition: A | Discharge status: Home / Self Care | Payer: PRIVATE HEALTH INSURANCE | Source: Ambulatory Visit | Attending: Obstetrics and Gynecology | Admitting: Obstetrics and Gynecology

## 2016-03-11 DIAGNOSIS — Z1231 Encounter for screening mammogram for malignant neoplasm of breast: Secondary | ICD-10-CM | POA: Diagnosis present

## 2016-11-29 ENCOUNTER — Encounter: Payer: Self-pay | Admitting: Family Medicine

## 2016-11-29 ENCOUNTER — Ambulatory Visit (INDEPENDENT_AMBULATORY_CARE_PROVIDER_SITE_OTHER): Payer: PRIVATE HEALTH INSURANCE | Admitting: Family Medicine

## 2016-11-29 VITALS — BP 118/71 | HR 85 | Temp 98.5°F | Resp 12 | Wt 146.0 lb

## 2016-11-29 DIAGNOSIS — R002 Palpitations: Secondary | ICD-10-CM | POA: Diagnosis not present

## 2016-11-29 DIAGNOSIS — Z23 Encounter for immunization: Secondary | ICD-10-CM

## 2016-11-29 MED ORDER — PROPRANOLOL HCL 20 MG PO TABS: ORAL_TABLET | ORAL | 0 refills | Status: DC

## 2016-11-29 NOTE — Progress Notes (Signed)
Subjective:  Patient ID: Leslie Ruiz, female    DOB: Oct 11, 1966  Age: 51 y.o. MRN: [id number]  CC: Palpitations  HPI:  51 year old female presents with the above complaints.  Patient has had palpitations in the past. She previously had a suppressed TSH with normal thyroid hormone levels. She saw endocrinology and suddenly had follow-up studies which were normal. She states that she's had more frequent and bothersome palpitations over the past few weeks. Particularly at night. She states that she feels like her heart is racing and beating hard. No reports of irregular beats or feelings of skipping beats/double bleeding. She has recently seen her GYN and had normal thyroid studies on 12/27. She states that she is quite concerned about her palpitations. She is concerned that she has underlying hyperthyroidism. She is also concerned about cardiac disease. No reports of shortness of breath. No chest pain. No other associated symptoms. She does note that she does have some anxiety. No other complaints or concerns at this time.  Social Hx   Social History   Social History  . Marital status: Married    Spouse name: N/A  . Number of children: N/A  . Years of education: N/A   Social History Main Topics  . Smoking status: Never Smoker  . Smokeless tobacco: Never Used  . Alcohol use 0.0 oz/week  . Drug use: No  . Sexual activity: Not Asked   Other Topics Concern  . None   Social History Narrative   Lives in [place] with husband and daughter 66. Cat and dog in home.      Diet - gluten free   Exercise - horseback riding, walking      Work - home, [employer]    Review of Systems  Constitutional: Negative.   Cardiovascular: Positive for palpitations.   Objective:  BP 118/71   Pulse 85   Temp 98.5 F (36.9 C) (Oral)   Resp 12   Wt 146 lb (66.2 kg)   SpO2 99%   BMI 24.67 kg/m   BP/Weight 11/29/2016 12/13/2015 11/13/2015  Systolic BP 118 137 120  Diastolic BP 71 74 80  Wt.  (Lbs) 146 142 141  BMI 24.67 24.01 23.84   Physical Exam  Constitutional: She is oriented to person, place, and time. She appears well-developed. No distress.  Cardiovascular: Normal rate and regular rhythm.   Pulmonary/Chest: Effort normal and breath sounds normal.  Neurological: She is alert and oriented to person, place, and time.  Psychiatric: She has a normal mood and affect.  Vitals reviewed.  Lab Results  Component Value Date   WBC 5.5 11/13/2015   HGB 12.9 11/13/2015   HCT 38.8 11/13/2015   PLT 282.0 11/13/2015   GLUCOSE 113 (H) 11/13/2015   CHOL 207 (H) 11/09/2014   TRIG 153.0 (H) 11/09/2014   HDL 50.80 11/09/2014   LDLDIRECT 151.2 09/30/2013   LDLCALC 126 (H) 11/09/2014   ALT 13 11/13/2015   AST 17 11/13/2015   NA 140 11/13/2015   K 4.2 11/13/2015   CL 101 11/13/2015   CREATININE 0.75 11/13/2015   BUN 11 11/13/2015   CO2 29 11/13/2015   TSH 2.630 11/13/2015   MICROALBUR 0.4 11/09/2014    Assessment & Plan:   Problem List Items Addressed This Visit    Palpitations    New problem, worsening. Does not appear to be from hyperthyroidism. Suspect anxiety but I cannot rule out underlying arrhythmia or PAC's/PVC's. Sending to cardiology for holter. PRN Inderal for palpitations.  Other Visit Diagnoses    Encounter for immunization       Relevant Orders   Flu Vaccine QUAD 36+ mos IM (Completed)     Meds ordered this encounter  Medications  . propranolol (INDERAL) 20 MG tablet    Sig: 3 times daily as needed for palpitations.    Dispense:  30 tablet    Refill:  0   Follow-up: Return in about 3 months (around 02/27/2017).  Everlene OtherJayce Salimata Christenson DO [facility]

## 2016-11-29 NOTE — Assessment & Plan Note (Signed)
New problem, worsening. Does not appear to be from hyperthyroidism. Suspect anxiety but I cannot rule out underlying arrhythmia or PAC's/PVC's. Sending to cardiology for holter. PRN Inderal for palpitations.

## 2016-11-29 NOTE — Progress Notes (Signed)
Pre visit review using our clinic review tool, if applicable. No additional management support is needed unless otherwise documented below in the visit note. 

## 2016-11-29 NOTE — Patient Instructions (Signed)
We will call regarding the referral.  Use the medication if needed (if you desire).  Follow up in 3 months.  Take care  Dr. Adriana Simasook

## 2017-02-26 ENCOUNTER — Other Ambulatory Visit: Payer: Self-pay | Admitting: Obstetrics and Gynecology

## 2017-02-26 DIAGNOSIS — Z1231 Encounter for screening mammogram for malignant neoplasm of breast: Secondary | ICD-10-CM

## 2017-02-27 ENCOUNTER — Encounter: Payer: Self-pay | Admitting: Family Medicine

## 2017-02-27 ENCOUNTER — Ambulatory Visit (INDEPENDENT_AMBULATORY_CARE_PROVIDER_SITE_OTHER): Payer: PRIVATE HEALTH INSURANCE | Admitting: Family Medicine

## 2017-02-27 DIAGNOSIS — H6980 Other specified disorders of Eustachian tube, unspecified ear: Secondary | ICD-10-CM | POA: Insufficient documentation

## 2017-02-27 DIAGNOSIS — R0989 Other specified symptoms and signs involving the circulatory and respiratory systems: Secondary | ICD-10-CM | POA: Insufficient documentation

## 2017-02-27 DIAGNOSIS — R002 Palpitations: Secondary | ICD-10-CM

## 2017-02-27 DIAGNOSIS — H6981 Other specified disorders of Eustachian tube, right ear: Secondary | ICD-10-CM | POA: Diagnosis not present

## 2017-02-27 DIAGNOSIS — R6889 Other general symptoms and signs: Secondary | ICD-10-CM | POA: Diagnosis not present

## 2017-02-27 NOTE — Progress Notes (Signed)
Pre visit review using our clinic review tool, if applicable. No additional management support is needed unless otherwise documented below in the visit note. 

## 2017-02-27 NOTE — Assessment & Plan Note (Signed)
New problem. Trial of flonase.

## 2017-02-27 NOTE — Progress Notes (Signed)
Subjective:  Patient ID: Leslie Ruiz, female    DOB: May 04, 1966  Age: 51 y.o. MRN: [id number]  CC: Follow up  HPI:  51 year old female with a history of palpitation presents for follow-up.  Palpitations  Improved and occurring less frequently. Uses PRN Inderal.  R ear discomfort  Patient reports that she's had intermittent trouble with her right ear for the past 4-5 weeks.  She states that she feels like there is something in her right ear. She suspects that it is a buildup of wax.  She states that it often awakens her from sleep.  No known inciting factor.   No known exacerbating or relieving factors.  No other associated symptoms.  Throat clearing  Patient reports that she's had ongoing issues with frequent clearing of her throat.  She states it occurs predominantly after eating.  No medications or interventions tried.  No hoarseness.  No reports of reflux.  No other complaints or concerns at this time.   Social Hx   Social History   Social History  . Marital status: Married    Spouse name: N/A  . Number of children: N/A  . Years of education: N/A   Social History Main Topics  . Smoking status: Never Smoker  . Smokeless tobacco: Never Used  . Alcohol use 0.0 oz/week  . Drug use: No  . Sexual activity: Not Asked   Other Topics Concern  . None   Social History Narrative   Lives in [place] with husband and daughter 66. Cat and dog in home.      Diet - gluten free   Exercise - horseback riding, walking      Work - home, [employer]    Review of Systems  Constitutional: Negative.   HENT: Positive for ear pain.   Cardiovascular: Positive for palpitations.   Objective:  BP 130/70   Pulse 82   Temp 98.3 F (36.8 C) (Oral)   Wt 144 lb 8 oz (65.5 kg)   SpO2 98%   BMI 24.42 kg/m   BP/Weight 02/27/2017 11/29/2016 12/13/2015  Systolic BP 130 118 137  Diastolic BP 70 71 74  Wt. (Lbs) 144.5 146 142  BMI 24.42 24.67 24.01   Physical Exam   Constitutional: She appears well-developed. No distress.  HENT:  Head: Normocephalic and atraumatic.  Mouth/Throat: Oropharynx is clear and moist.  R TM normal.   Cardiovascular: Normal rate and regular rhythm.   Pulmonary/Chest: Effort normal. She has no wheezes. She has no rales.  Psychiatric: She has a normal mood and affect.  Vitals reviewed.   Lab Results  Component Value Date   WBC 5.5 11/13/2015   HGB 12.9 11/13/2015   HCT 38.8 11/13/2015   PLT 282.0 11/13/2015   GLUCOSE 113 (H) 11/13/2015   CHOL 207 (H) 11/09/2014   TRIG 153.0 (H) 11/09/2014   HDL 50.80 11/09/2014   LDLDIRECT 151.2 09/30/2013   LDLCALC 126 (H) 11/09/2014   ALT 13 11/13/2015   AST 17 11/13/2015   NA 140 11/13/2015   K 4.2 11/13/2015   CL 101 11/13/2015   CREATININE 0.75 11/13/2015   BUN 11 11/13/2015   CO2 29 11/13/2015   TSH 2.630 11/13/2015   MICROALBUR 0.4 11/09/2014    Assessment & Plan:   Problem List Items Addressed This Visit    Throat clearing    New problem. From postnasal drip vs silent reflux. Trial of flonase and omeprazole.       Palpitations    Improved.  Continue PRN Inderal.      Eustachian tube dysfunction    New problem. Trial of flonase.        Follow-up: 6 months to 1 year.   Everlene Other DO [facility]

## 2017-02-27 NOTE — Patient Instructions (Signed)
Flonase daily.  Omeprazole daily for the next 2 weeks.  Follow up in 6 months to 1 year.  Take care  Dr. Adriana Simas

## 2017-02-27 NOTE — Assessment & Plan Note (Signed)
New problem. From postnasal drip vs silent reflux. Trial of flonase and omeprazole.

## 2017-02-27 NOTE — Assessment & Plan Note (Signed)
Improved. Continue PRN Inderal.

## 2017-03-26 ENCOUNTER — Ambulatory Visit
Admission: RE | Admit: 2017-03-26 | Discharge: 2017-03-26 | Disposition: A | Discharge status: Home / Self Care | Payer: PRIVATE HEALTH INSURANCE | Source: Ambulatory Visit | Attending: Obstetrics and Gynecology | Admitting: Obstetrics and Gynecology

## 2017-03-26 DIAGNOSIS — Z1231 Encounter for screening mammogram for malignant neoplasm of breast: Secondary | ICD-10-CM | POA: Diagnosis present

## 2018-01-20 ENCOUNTER — Ambulatory Visit (INDEPENDENT_AMBULATORY_CARE_PROVIDER_SITE_OTHER): Payer: PRIVATE HEALTH INSURANCE | Admitting: Family Medicine

## 2018-01-20 ENCOUNTER — Encounter: Payer: Self-pay | Admitting: Family Medicine

## 2018-01-20 VITALS — BP 106/74 | HR 84 | Temp 98.4°F | Resp 16 | Ht 65.0 in | Wt 141.0 lb

## 2018-01-20 DIAGNOSIS — K219 Gastro-esophageal reflux disease without esophagitis: Secondary | ICD-10-CM | POA: Diagnosis not present

## 2018-01-20 DIAGNOSIS — R6889 Other general symptoms and signs: Secondary | ICD-10-CM

## 2018-01-20 DIAGNOSIS — R002 Palpitations: Secondary | ICD-10-CM | POA: Diagnosis not present

## 2018-01-20 DIAGNOSIS — R0989 Other specified symptoms and signs involving the circulatory and respiratory systems: Secondary | ICD-10-CM

## 2018-01-20 MED ORDER — PROPRANOLOL HCL ER 60 MG PO CP24: 60.0000 mg | ORAL_CAPSULE | Freq: Every day | ORAL | 3 refills | Status: DC

## 2018-01-20 NOTE — Assessment & Plan Note (Signed)
Chronic, longstanding problem, butnew to me Last lab work normal in 2016 Will check CBC, CMP, TSH today Advised patient that heart is in regular rhythm currently, but she is also currently asymptomatic Referral to Cardiology for Holter monitor to rule out intermittent arrhythmia Start propranolol LA 60mg  daily - start with lower dose to due lower baseline BP F/u in 3 months

## 2018-01-20 NOTE — Patient Instructions (Signed)
Palpitations A palpitation is the feeling that your heartbeat is irregular or is faster than normal. It may feel like your heart is fluttering or skipping a beat. Palpitations are usually not a serious problem. They may be caused by many things, including smoking, caffeine, alcohol, stress, and certain medicines. Although most causes of palpitations are not serious, palpitations can be a sign of a serious medical problem. In some cases, you may need further medical evaluation. Follow these instructions at home: Pay attention to any changes in your symptoms. Take these actions to help with your condition:  Avoid the following: ? Caffeinated coffee, tea, soft drinks, diet pills, and energy drinks. ? Chocolate. ? Alcohol.  Do not use any tobacco products, such as cigarettes, chewing tobacco, and e-cigarettes. If you need help quitting, ask your health care provider.  Try to reduce your stress and anxiety. Things that can help you relax include: ? Yoga. ? Meditation. ? Physical activity, such as swimming, jogging, or walking. ? Biofeedback. This is a method that helps you learn to use your mind to control things in your body, such as your heartbeats.  Get plenty of rest and sleep.  Take over-the-counter and prescription medicines only as told by your health care provider.  Keep all follow-up visits as told by your health care provider. This is important.  Contact a health care provider if:  You continue to have a fast or irregular heartbeat after 24 hours.  Your palpitations occur more often. Get help right away if:  You have chest pain or shortness of breath.  You have a severe headache.  You feel dizzy or you faint. This information is not intended to replace advice given to you by your health care provider. Make sure you discuss any questions you have with your health care provider. Document Released: 11/08/2000 Document Revised: 04/15/2016 Document Reviewed: 07/27/2015 Elsevier  Interactive Patient Education  2018 Elsevier Inc.  

## 2018-01-20 NOTE — Assessment & Plan Note (Signed)
Likely caused by GERD and post-nasal drip Ongoing, chronic problem that is fairly well controlled with nasal saline and PPI - continue

## 2018-01-20 NOTE — Assessment & Plan Note (Signed)
Well controlled with PPI Likely the cause of throat clearing Can continue PPI Has seen [facility] in the past

## 2018-01-20 NOTE — Progress Notes (Signed)
Patient: Leslie HammingYvonne E Ruiz, Female    DOB: 03/02/1966, 52 y.o.   MRN: [id number] Visit Date: 01/20/2018  Today's Provider: Shirlee LatchAngela Bacigalupo, MD   Chief Complaint  Patient presents with  . Establish Care   Subjective:    Establish Care Leslie Ruiz is a 52 y.o. female who presents today for health maintenance and to establish care. She feels fairly well. She reports she is not exercising regularly due to the cold, but does ride horses in the spring, summer and fall. She reports she is sleeping poorly.  Pt's main complaint is heart palpitations. She states this has been occurring for several years, but is gradually worsening. She states, "I can hear my heart pounding in my ears", which is causing pt to lose sleep. She was prescribed propanolol for this in the past, but was informed that this medication could cause her to lose more sleep, therefore she never started the medication. She denies chest pain, N/V, SOB. She states her TSH levels have been at the upper limits of normal in the past, and is questioning if this could be causing the sx.  Describes sensation as a pounding and fluttering in her chest.  Feels like she has run a marathon when laying down to sleep as her heart is beating so hard and fast.  She denies CP.  This does worry her and keeps her awake at night.  She is taking melatonin and benadryl to help get some sleep.  She is on a gluten free diet.  She was having joint pain, stiffness, myalgias, fatigue.  After starting GF diet ~10 years ago, she has had resolution of her symptoms.  She has never been diagnosed with Celiac disease. -----------------------------------------------------------------  Review of Systems  Constitutional: Negative.   HENT: Positive for rhinorrhea. Negative for congestion, dental problem, drooling, ear discharge, ear pain, facial swelling, hearing loss, mouth sores, nosebleeds, postnasal drip, sinus pressure, sinus pain, sneezing, sore  throat, tinnitus, trouble swallowing and voice change.   Eyes: Negative.   Respiratory: Negative.   Cardiovascular: Positive for palpitations. Negative for chest pain and leg swelling.  Gastrointestinal: Negative.   Endocrine: Positive for polyuria. Negative for cold intolerance, heat intolerance, polydipsia and polyphagia.  Genitourinary: Positive for frequency, urgency and vaginal pain. Negative for decreased urine volume, difficulty urinating, dyspareunia, dysuria, enuresis, flank pain, genital sores, hematuria, menstrual problem, pelvic pain, vaginal bleeding and vaginal discharge.  Musculoskeletal: Positive for neck stiffness. Negative for arthralgias, back pain, gait problem, joint swelling, myalgias and neck pain.  Skin: Negative.   Allergic/Immunologic: Positive for food allergies. Negative for environmental allergies and immunocompromised state.  Neurological: Positive for dizziness. Negative for tremors, seizures, syncope, facial asymmetry, speech difficulty, weakness, light-headedness, numbness and headaches.  Hematological: Negative.   Psychiatric/Behavioral: Negative.     Social History      She  reports that  has never smoked. she has never used smokeless tobacco. She reports that she drinks alcohol. She reports that she does not use drugs.       Social History   Socioeconomic History  . Marital status: Married    Spouse name: Rocky LinkKen  . Number of children: 1  . Years of education: 6716  . Highest education level: Bachelor's degree (e.g., BA, AB, BS)  Social Needs  . Financial resource strain: Not hard at all  . Food insecurity - worry: Never true  . Food insecurity - inability: Never true  . Transportation needs - medical: No  .  Transportation needs - non-medical: No  Occupational History  . Occupation: Heritage manager: [employer]    Comment: [employer]  Tobacco Use  . Smoking status: Never Smoker  . Smokeless tobacco: Never Used  Substance and Sexual  Activity  . Alcohol use: Yes    Alcohol/week: 0.0 oz    Comment: rare  . Drug use: No  . Sexual activity: Yes    Partners: Male    Birth control/protection: OCP  Other Topics Concern  . None  Social History Narrative  . None    Past Medical History:  Diagnosis Date  . Dupuytren's contracture of left hand   . Gluten intolerance      Patient Active Problem List   Diagnosis Date Noted  . GERD (gastroesophageal reflux disease) 01/20/2018  . Throat clearing 02/27/2017  . Palpitations 11/13/2015  . Adjustment disorder with mixed anxiety and depressed mood 11/13/2015  . 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 CESAREAN SECTION      Family History        Family Status  Relation Name Status  . Mother  Deceased  . Father  Alive  . Sister  Alive  . Mat Uncle  Deceased  . Brother  Alive  . PGM  (Not Specified)  . PGF  (Not Specified)  . MGM  (Not Specified)  . MGF  (Not Specified)  . Neg Hx  (Not Specified)        Her family history includes Alzheimer's disease in her mother; Arthritis in her father, mother, and sister; COPD in her father and mother; Cancer in her mother; Celiac disease in her sister; Healthy in her brother; Heart disease in her father, paternal grandfather, and paternal grandmother; Hypertension in her mother, paternal grandfather, and paternal grandmother; Kidney disease in her mother and sister; Kidney disease (age of onset: 17) in her maternal uncle; Lung cancer in her father; Stroke in her maternal grandfather and maternal grandmother. There is no history of Colon cancer, Breast cancer, Ovarian cancer, or Cervical cancer.      Allergies  Allergen Reactions  . Codeine Itching    Can take cough syrup   . Gluten Meal   . Morphine And Related Nausea And Vomiting     Current Outpatient Medications:  .  diphenhydrAMINE (BENADRYL ALLERGY) 25 MG tablet, Take 25 mg by mouth at bedtime as needed.,  Disp: , Rfl:  .  esomeprazole (NEXIUM) 20 MG capsule, Take 20 mg by mouth daily at 12 noon., Disp: , Rfl:  .  levonorgestrel-ethinyl estradiol (AVIANE) 0.1-20 MG-MCG tablet, Take 1 tablet by mouth daily., Disp: 1 Package, Rfl: 11 .  Melatonin 10 MG TABS, Take 10 mg by mouth at bedtime., Disp: , Rfl:  .  naproxen sodium (ALEVE) 220 MG tablet, Take 220 mg by mouth at bedtime., Disp: , Rfl:  .  NONFORMULARY OR COMPOUNDED ITEM, Estriol 1 mg/gram vaginal cream 1/4 applicator vaginally nightly for two weeks, every other night for two weeks, then twice a week thereafter, Disp: , Rfl:  .  propranolol ER (INDERAL LA) 60 MG 24 hr capsule, Take 1 capsule (60 mg total) by mouth daily., Disp: 30 capsule, Rfl: 3   Patient Care Team: Erasmo Downer, MD as PCP - General (Family Medicine)      Objective:   Vitals: BP 106/74 (BP Location: Left Arm, Patient Position: Sitting, Cuff Size: Normal)   Pulse 84  Temp 98.4 F (36.9 C) (Oral)   Resp 16   Ht 5\' 5"  (1.651 m)   Wt 141 lb (64 kg)   SpO2 99%   BMI 23.46 kg/m    Vitals:   01/20/18 1009  BP: 106/74  Pulse: 84  Resp: 16  Temp: 98.4 F (36.9 C)  TempSrc: Oral  SpO2: 99%  Weight: 141 lb (64 kg)  Height: 5\' 5"  (1.651 m)     Physical Exam  Constitutional: She is oriented to person, place, and time. She appears well-developed and well-nourished. No distress.  HENT:  Head: Normocephalic and atraumatic.  Right Ear: External ear normal.  Left Ear: External ear normal.  Nose: Nose normal.  Mouth/Throat: Oropharynx is clear and moist.  Eyes: Conjunctivae and EOM are normal. Pupils are equal, round, and reactive to light. No scleral icterus.  Neck: Neck supple. No thyromegaly present.  Cardiovascular: Normal rate, regular rhythm, normal heart sounds and intact distal pulses.  No murmur heard. Pulmonary/Chest: Breath sounds normal. No respiratory distress. She has no wheezes. She has no rales.  Abdominal: Soft. Bowel sounds are normal.  She exhibits no distension. There is no tenderness. There is no rebound and no guarding.  Musculoskeletal: She exhibits no edema or deformity.  Lymphadenopathy:    She has no cervical adenopathy.  Neurological: She is alert and oriented to person, place, and time.  Skin: Skin is warm and dry. No rash noted.  Psychiatric: She has a normal mood and affect. Her behavior is normal.  Vitals reviewed.    Depression Screen PHQ 2/9 Scores 01/20/2018  PHQ - 2 Score 0      Assessment & Plan:    Problem List Items Addressed This Visit      Digestive   GERD (gastroesophageal reflux disease)    Well controlled with PPI Likely the cause of throat clearing Can continue PPI Has seen [facility] in the past      Relevant Medications   esomeprazole (NEXIUM) 20 MG capsule     Other   Palpitations - Primary    Chronic, longstanding problem, butnew to me Last lab work normal in 2016 Will check CBC, CMP, TSH today Advised patient that heart is in regular rhythm currently, but she is also currently asymptomatic Referral to Cardiology for Holter monitor to rule out intermittent arrhythmia Start propranolol LA 60mg  daily - start with lower dose to due lower baseline BP F/u in 3 months       Relevant Orders   Ambulatory referral to Cardiology   CBC w/Diff/Platelet   Comprehensive metabolic panel   TSH   Throat clearing    Likely caused by GERD and post-nasal drip Ongoing, chronic problem that is fairly well controlled with nasal saline and PPI - continue          Return in about 3 months (around 04/19/2018) for physical.   The entirety of the information documented in the History of Present Illness, Review of Systems and Physical Exam were personally obtained by me. Portions of this information were initially documented by Irving Burton Ratchford, CMA and reviewed by me for thoroughness and accuracy.    Erasmo Downer, MD, MPH [facility] 01/20/2018 11:45 AM

## 2018-01-21 ENCOUNTER — Telehealth: Payer: Self-pay

## 2018-01-21 LAB — CBC WITH DIFFERENTIAL/PLATELET
Basophils Absolute: 0 x10E3/uL (ref 0.0–0.2)
Basos: 1 %
EOS (ABSOLUTE): 0.2 x10E3/uL (ref 0.0–0.4)
Eos: 3 %
Hematocrit: 38.4 % (ref 34.0–46.6)
Hemoglobin: 12.8 g/dL (ref 11.1–15.9)
Immature Grans (Abs): 0 x10E3/uL (ref 0.0–0.1)
Immature Granulocytes: 0 %
Lymphocytes Absolute: 1.3 x10E3/uL (ref 0.7–3.1)
Lymphs: 23 %
MCH: 29.7 pg (ref 26.6–33.0)
MCHC: 33.3 g/dL (ref 31.5–35.7)
MCV: 89 fL (ref 79–97)
Monocytes Absolute: 0.4 x10E3/uL (ref 0.1–0.9)
Monocytes: 7 %
Neutrophils Absolute: 3.7 x10E3/uL (ref 1.4–7.0)
Neutrophils: 66 %
Platelets: 319 x10E3/uL (ref 150–379)
RBC: 4.31 x10E6/uL (ref 3.77–5.28)
RDW: 13.6 % (ref 12.3–15.4)
WBC: 5.5 x10E3/uL (ref 3.4–10.8)

## 2018-01-21 LAB — COMPREHENSIVE METABOLIC PANEL WITH GFR
ALT: 22 IU/L (ref 0–32)
AST: 23 IU/L (ref 0–40)
Albumin/Globulin Ratio: 1.6 (ref 1.2–2.2)
Albumin: 4.2 g/dL (ref 3.5–5.5)
Alkaline Phosphatase: 73 IU/L (ref 39–117)
BUN/Creatinine Ratio: 16 (ref 9–23)
BUN: 12 mg/dL (ref 6–24)
Bilirubin Total: 0.2 mg/dL (ref 0.0–1.2)
CO2: 24 mmol/L (ref 20–29)
Calcium: 9.2 mg/dL (ref 8.7–10.2)
Chloride: 101 mmol/L (ref 96–106)
Creatinine, Ser: 0.73 mg/dL (ref 0.57–1.00)
GFR calc Af Amer: 109 mL/min/1.73
GFR calc non Af Amer: 94 mL/min/1.73
Globulin, Total: 2.6 g/dL (ref 1.5–4.5)
Glucose: 86 mg/dL (ref 65–99)
Potassium: 4.4 mmol/L (ref 3.5–5.2)
Sodium: 139 mmol/L (ref 134–144)
Total Protein: 6.8 g/dL (ref 6.0–8.5)

## 2018-01-21 LAB — TSH: TSH: 0.633 u[IU]/mL (ref 0.450–4.500)

## 2018-01-21 NOTE — Telephone Encounter (Signed)
Patient advised as below.  

## 2018-01-21 NOTE — Telephone Encounter (Signed)
-----   Message from Erasmo DownerAngela M Bacigalupo, MD sent at 01/21/2018  8:33 AM EST ----- Normal Blood counts, kidney function, liver function, electrolytes, Thyroid function  Beryle FlockBacigalupo, Marzella SchleinAngela M, MD, MPH [facility] 01/21/2018 8:33 AM

## 2018-01-30 DIAGNOSIS — I491 Atrial premature depolarization: Secondary | ICD-10-CM | POA: Insufficient documentation

## 2018-04-22 ENCOUNTER — Encounter: Payer: Self-pay | Admitting: Family Medicine

## 2018-04-22 ENCOUNTER — Ambulatory Visit (INDEPENDENT_AMBULATORY_CARE_PROVIDER_SITE_OTHER): Payer: PRIVATE HEALTH INSURANCE | Admitting: Family Medicine

## 2018-04-22 VITALS — BP 106/80 | HR 74 | Temp 98.4°F | Resp 16 | Ht 65.0 in | Wt 137.0 lb

## 2018-04-22 DIAGNOSIS — Z1159 Encounter for screening for other viral diseases: Secondary | ICD-10-CM | POA: Diagnosis not present

## 2018-04-22 DIAGNOSIS — Z1231 Encounter for screening mammogram for malignant neoplasm of breast: Secondary | ICD-10-CM

## 2018-04-22 DIAGNOSIS — Z0000 Encounter for general adult medical examination without abnormal findings: Secondary | ICD-10-CM | POA: Diagnosis not present

## 2018-04-22 DIAGNOSIS — Z114 Encounter for screening for human immunodeficiency virus [HIV]: Secondary | ICD-10-CM

## 2018-04-22 DIAGNOSIS — Z1239 Encounter for other screening for malignant neoplasm of breast: Secondary | ICD-10-CM

## 2018-04-22 MED ORDER — PROPRANOLOL HCL ER 60 MG PO CP24: 60.0000 mg | ORAL_CAPSULE | Freq: Every day | ORAL | 3 refills | Status: DC

## 2018-04-22 NOTE — Patient Instructions (Signed)
 The CDC recommends two doses of Shingrix (the shingles vaccine) separated by 2 to 6 months for adults age 54 years and older. I recommend checking with your insurance plan regarding coverage for this vaccine.     Preventive Care 40-64 Years, Female Preventive care refers to lifestyle choices and visits with your health care provider that can promote health and wellness. What does preventive care include?  A yearly physical exam. This is also called an annual well check.  Dental exams once or twice a year.  Routine eye exams. Ask your health care provider how often you should have your eyes checked.  Personal lifestyle choices, including: ? Daily care of your teeth and gums. ? Regular physical activity. ? Eating a healthy diet. ? Avoiding tobacco and drug use. ? Limiting alcohol use. ? Practicing safe sex. ? Taking low-dose aspirin daily starting at age 54. ? Taking vitamin and mineral supplements as recommended by your health care provider. What happens during an annual well check? The services and screenings done by your health care provider during your annual well check will depend on your age, overall health, lifestyle risk factors, and family history of disease. Counseling Your health care provider may ask you questions about your:  Alcohol use.  Tobacco use.  Drug use.  Emotional well-being.  Home and relationship well-being.  Sexual activity.  Eating habits.  Work and work environment.  Method of birth control.  Menstrual cycle.  Pregnancy history.  Screening You may have the following tests or measurements:  Height, weight, and BMI.  Blood pressure.  Lipid and cholesterol levels. These may be checked every 5 years, or more frequently if you are over 54 years old.  Skin check.  Lung cancer screening. You may have this screening every year starting at age 55 if you have a 30-pack-year history of smoking and currently smoke or have quit within the  past 15 years.  Fecal occult blood test (FOBT) of the stool. You may have this test every year starting at age 54.  Flexible sigmoidoscopy or colonoscopy. You may have a sigmoidoscopy every 5 years or a colonoscopy every 10 years starting at age 54.  Hepatitis C blood test.  Hepatitis B blood test.  Sexually transmitted disease (STD) testing.  Diabetes screening. This is done by checking your blood sugar (glucose) after you have not eaten for a while (fasting). You may have this done every 1-3 years.  Mammogram. This may be done every 1-2 years. Talk to your health care provider about when you should start having regular mammograms. This may depend on whether you have a family history of breast cancer.  BRCA-related cancer screening. This may be done if you have a family history of breast, ovarian, tubal, or peritoneal cancers.  Pelvic exam and Pap test. This may be done every 3 years starting at age 21. Starting at age 30, this may be done every 5 years if you have a Pap test in combination with an HPV test.  Bone density scan. This is done to screen for osteoporosis. You may have this scan if you are at high risk for osteoporosis.  Discuss your test results, treatment options, and if necessary, the need for more tests with your health care provider. Vaccines Your health care provider may recommend certain vaccines, such as:  Influenza vaccine. This is recommended every year.  Tetanus, diphtheria, and acellular pertussis (Tdap, Td) vaccine. You may need a Td booster every 10 years.  Varicella vaccine. You   may need this if you have not been vaccinated.  Zoster vaccine. You may need this after age 60.  Measles, mumps, and rubella (MMR) vaccine. You may need at least one dose of MMR if you were born in 1957 or later. You may also need a second dose.  Pneumococcal 13-valent conjugate (PCV13) vaccine. You may need this if you have certain conditions and were not previously  vaccinated.  Pneumococcal polysaccharide (PPSV23) vaccine. You may need one or two doses if you smoke cigarettes or if you have certain conditions.  Meningococcal vaccine. You may need this if you have certain conditions.  Hepatitis A vaccine. You may need this if you have certain conditions or if you travel or work in places where you may be exposed to hepatitis A.  Hepatitis B vaccine. You may need this if you have certain conditions or if you travel or work in places where you may be exposed to hepatitis B.  Haemophilus influenzae type b (Hib) vaccine. You may need this if you have certain conditions.  Talk to your health care provider about which screenings and vaccines you need and how often you need them. This information is not intended to replace advice given to you by your health care provider. Make sure you discuss any questions you have with your health care provider. Document Released: 12/08/2015 Document Revised: 07/31/2016 Document Reviewed: 09/12/2015 Elsevier Interactive Patient Education  2018 [employer]vier Inc.  

## 2018-04-22 NOTE — Progress Notes (Signed)
Patient: Leslie Ruiz, Female    DOB: 13-Jun-1966, 52 y.o.   MRN: [id number] Visit Date: 04/22/2018  Today's Provider: Shirlee Latch, MD   I, Joslyn Hy, CMA, am acting as scribe for Shirlee Latch, MD.  Chief Complaint  Patient presents with  . Annual Exam   Subjective:    Annual physical exam Leslie Ruiz is a 52 y.o. female who presents today for health maintenance and complete physical. She feels well. She reports exercising "some". She walks and rides horses with her daughter. She reports she is sleeping well with Melatonin.  While washing her hands yesterday, patient noticed numbness along medial aspect of right small finger as well as loss of color.  The finger felt cold to the touch of the time.  This resolved within seconds.  She has no numbness now.  She does have an area that looks bruised at the base of the finger.  She does not remember any trauma to the area.  Color is improving  Pt is fasting for labs.  Last pap- 02/05/2016- NIL per [facility]. No recent H/O abnormal paps.  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 Ordered by GYN. Last colonoscopy- 01/13/2015- diverticulosis, internal hemorrhoids. Otherwise WNL. Repeat 10 years.  -----------------------------------------------------------------   Review of Systems  Constitutional: Positive for unexpected weight change. Negative for activity change, appetite change, chills, diaphoresis, fatigue and fever.  HENT: Positive for postnasal drip. Negative for congestion, dental problem, drooling, ear discharge, ear pain, facial swelling, hearing loss, mouth sores, nosebleeds, rhinorrhea, sinus pressure, sinus pain, sneezing, sore throat, tinnitus, trouble swallowing and voice change.   Eyes: Negative.   Respiratory: Negative.   Cardiovascular: Positive for palpitations. Negative for chest pain and leg swelling.  Gastrointestinal: Negative.   Endocrine: Negative.     Genitourinary: Positive for vaginal discharge. Negative for decreased urine volume, difficulty urinating, dyspareunia, dysuria, enuresis, flank pain, frequency, genital sores, hematuria, menstrual problem, pelvic pain, urgency, vaginal bleeding and vaginal pain.  Musculoskeletal: Negative.   Skin: Negative.   Allergic/Immunologic: Positive for food allergies. Negative for environmental allergies and immunocompromised state.  Neurological: Positive for dizziness and light-headedness. Negative for tremors, seizures, syncope, facial asymmetry, speech difficulty, weakness, numbness and headaches.  Hematological: Negative.   Psychiatric/Behavioral: Negative.     Social History      She  reports that she has never smoked. She has never used smokeless tobacco. She reports that she drinks alcohol. She reports that she does not use drugs.       Social History   Socioeconomic History  . Marital status: Married    Spouse name: Rocky Link  . Number of children: 1  . Years of education: 53  . Highest education level: Bachelor's degree (e.g., BA, AB, BS)  Occupational History  . Occupation: Heritage manager: OTHER    Comment: [employer]  Social Needs  . Financial resource strain: Not hard at all  . Food insecurity:    Worry: Never true    Inability: Never true  . Transportation needs:    Medical: No    Non-medical: No  Tobacco Use  . Smoking status: Never Smoker  . Smokeless tobacco: Never Used  Substance and Sexual Activity  . Alcohol use: Yes    Alcohol/week: 0.0 oz    Comment: rare  . Drug use: No  . Sexual activity: Yes    Partners: Male    Birth control/protection: OCP  Lifestyle  . Physical activity:    Days per week: 0 days    Minutes per session: 0 min  . Stress: Not on file  Relationships  . Social connections:    Talks on phone: Not on file    Gets together: Not on file    Attends religious service: Not on file    Active member of club or organization: Not  on file    Attends meetings of clubs or organizations: Not on file    Relationship status: Not on file  Other Topics Concern  . Not on file  Social History Narrative  . Not on file    Past Medical History:  Diagnosis Date  . Dupuytren's contracture of left hand   . Gluten intolerance      Patient Active Problem List   Diagnosis Date Noted  . GERD (gastroesophageal reflux disease) 01/20/2018  . Throat clearing 02/27/2017  . Palpitations 11/13/2015  . Adjustment disorder with mixed anxiety and depressed mood 11/13/2015  . 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 CESAREAN SECTION      Family History        Family Status  Relation Name Status  . Mother  Deceased  . Father  Alive  . Sister  Alive  . Mat Uncle  Deceased  . Brother  Alive  . PGM  (Not Specified)  . PGF  (Not Specified)  . MGM  (Not Specified)  . MGF  (Not Specified)  . Neg Hx  (Not Specified)        Her family history includes Alzheimer's disease in her mother; Arthritis in her father, mother, and sister; COPD in her father and mother; Cancer in her mother; Celiac disease in her sister; Healthy in her brother; Heart disease in her father, paternal grandfather, and paternal grandmother; Hypertension in her mother, paternal grandfather, and paternal grandmother; Kidney disease in her mother and sister; Kidney disease (age of onset: 60) in her maternal uncle; Lung cancer in her father; Stroke in her maternal grandfather and maternal grandmother. There is no history of Colon cancer, Breast cancer, Ovarian cancer, Cervical cancer, or Drug abuse.      Allergies  Allergen Reactions  . Codeine Itching    Can take cough syrup   . Gluten Meal   . Morphine And Related Nausea And Vomiting     Current Outpatient Medications:  .  diphenhydrAMINE (BENADRYL ALLERGY) 25 MG tablet, Take 25 mg by mouth at bedtime as needed., Disp: , Rfl:  .  levonorgestrel-ethinyl  estradiol (AVIANE) 0.1-20 MG-MCG tablet, Take 1 tablet by mouth daily., Disp: 1 Package, Rfl: 11 .  Melatonin 10 MG TABS, Take 10 mg by mouth at bedtime., Disp: , Rfl:  .  naproxen sodium (ALEVE) 220 MG tablet, Take 220 mg by mouth at bedtime., Disp: , Rfl:  .  NONFORMULARY OR COMPOUNDED ITEM, Estriol 1 mg/gram vaginal cream 1/4 applicator vaginally nightly for two weeks, every other night for two weeks, then twice a week thereafter, Disp: , Rfl:  .  omeprazole (PRILOSEC) 20 MG capsule, Take 20 mg by mouth daily., Disp: , Rfl:  .  propranolol ER (INDERAL LA) 60 MG 24 hr capsule, Take 1 capsule (60 mg total) by mouth daily., Disp: 90 capsule, Rfl: 3   Patient Care Team: Erasmo Downer, MD as PCP - General (Family Medicine)      Objective:   Vitals: BP 106/80 (BP Location: Left Arm,  Patient Position: Sitting, Cuff Size: Normal)   Pulse 74   Temp 98.4 F (36.9 C) (Oral)   Resp 16   Ht  (1.651 m)   Wt 137 lb (62.1 kg)   SpO2 99%   BMI 22.80 kg/m    Vitals:   04/22/18 0915  BP: 106/80  Pulse: 74  Resp: 16  Temp: 98.4 F (36.9 C)  TempSrc: Oral  SpO2: 99%  Weight: 137 lb (62.1 kg)  Height:  (1.651 m)     Physical Exam  Constitutional: She is oriented to person, place, and time. She appears well-developed and well-nourished. No distress.  HENT:  Head: Normocephalic and atraumatic.  Right Ear: External ear normal.  Left Ear: External ear normal.  Nose: Nose normal.  Mouth/Throat: Oropharynx is clear and moist.  Eyes: Pupils are equal, round, and reactive to light. Conjunctivae and EOM are normal. No scleral icterus.  Neck: Neck supple. No thyromegaly present.  Cardiovascular: Normal rate, regular rhythm, normal heart sounds and intact distal pulses.  No murmur heard. Pulmonary/Chest: Effort normal and breath sounds normal. No respiratory distress. She has no wheezes. She has no rales.  Abdominal: Soft. Bowel sounds are normal. She exhibits no distension.  There is no tenderness. There is no rebound and no guarding.  Musculoskeletal: She exhibits no edema or deformity.  Small ecchymosis over base of R small finger, no TTP, ROM intact, pulses intact  Lymphadenopathy:    She has no cervical adenopathy.  Neurological: She is alert and oriented to person, place, and time.  Skin: Skin is warm and dry. Capillary refill takes less than 2 seconds. No rash noted.  Psychiatric: She has a normal mood and affect. Her behavior is normal.  Vitals reviewed.    Depression Screen PHQ 2/9 Scores 04/22/2018 01/20/2018  PHQ - 2 Score 0 0      Assessment & Plan:     Routine Health Maintenance and Physical Exam  Exercise Activities and Dietary recommendations Goals    None      Immunization History  Administered Date(s) Administered  . Influenza Split 09/25/2012  . Influenza,inj,Quad PF,6+ Mos 09/30/2013, 09/30/2014, 11/13/2015, 11/29/2016  . Tdap 09/25/2012    Health Maintenance  Topic Date Due  . Hepatitis C Screening  Jul 20, 1966  . HIV Screening  05/29/1979  . INFLUENZA VACCINE  06/25/2018  . PAP SMEAR  02/05/2019  . MAMMOGRAM  03/27/2019  . TETANUS/TDAP  09/25/2022  . COLONOSCOPY  01/13/2025     Discussed health benefits of physical activity, and encouraged her to engage in regular exercise appropriate for her age and condition.    -------------------------------------------------------------------- Problem List Items Addressed This Visit    None    Visit Diagnoses    Encounter for annual physical exam    -  Primary   Relevant Orders   Lipid panel   Screening for HIV (human immunodeficiency virus)       Relevant Orders   HIV antibody (with reflex)   Need for hepatitis C screening test       Relevant Orders   Hepatitis C Antibody   Screening for breast cancer       Relevant Orders   MS DIGITAL SCREENING TOMO BILATERAL       Return in about 1 year (around 04/23/2019) for CPE.   The entirety of the information  documented in the History of Present Illness, Review of Systems and Physical Exam were personally obtained by me. Portions of this information were initially  documented by Joslyn Hy, CMA and reviewed by me for thoroughness and accuracy.    Erasmo Downer, MD, MPH [facility] 04/22/2018 10:18 AM

## 2018-04-23 LAB — HEPATITIS C ANTIBODY: Hep C Virus Ab: <0.1 s/co ratio (ref 0.0–0.9)

## 2018-04-23 LAB — HIV ANTIBODY (ROUTINE TESTING W REFLEX): HIV SCREEN 4TH GENERATION: NONREACTIVE

## 2018-04-23 LAB — LIPID PANEL
CHOLESTEROL TOTAL: 173 mg/dL (ref 100–199)
Chol/HDL Ratio: 3.9 ratio (ref 0.0–4.4)
HDL: 44 mg/dL (ref >39)
LDL Calculated: 100 mg/dL — ABNORMAL HIGH (ref 0–99)
TRIGLYCERIDES: 146 mg/dL (ref 0–149)
VLDL Cholesterol Cal: 29 mg/dL (ref 5–40)

## 2018-04-27 ENCOUNTER — Telehealth: Payer: Self-pay

## 2018-04-27 NOTE — Telephone Encounter (Signed)
Pt advised. She acknowledges understanding.  

## 2018-04-27 NOTE — Telephone Encounter (Signed)
-----   Message from Erasmo DownerAngela M Bacigalupo, MD sent at 04/23/2018  9:53 AM EDT ----- Cholesterol is good.  10 year risk of heart disease/stroke is low at 1.2%.  Negative HIV and hepatitis C screenings.  Erasmo DownerBacigalupo, Angela M, MD, MPH [facility] 04/23/2018 9:53 AM

## 2018-05-21 ENCOUNTER — Other Ambulatory Visit: Payer: Self-pay | Admitting: Family Medicine

## 2018-10-01 LAB — HM PAP SMEAR: HM Pap smear: NORMAL

## 2019-01-28 ENCOUNTER — Ambulatory Visit: Payer: PRIVATE HEALTH INSURANCE | Admitting: Family Medicine

## 2019-01-28 ENCOUNTER — Ambulatory Visit (INDEPENDENT_AMBULATORY_CARE_PROVIDER_SITE_OTHER): Payer: PRIVATE HEALTH INSURANCE | Admitting: Family Medicine

## 2019-01-28 ENCOUNTER — Encounter: Payer: Self-pay | Admitting: Family Medicine

## 2019-01-28 ENCOUNTER — Ambulatory Visit
Admission: RE | Admit: 2019-01-28 | Discharge: 2019-01-28 | Disposition: A | Discharge status: Home / Self Care | Payer: PRIVATE HEALTH INSURANCE | Attending: Family Medicine | Admitting: Family Medicine

## 2019-01-28 ENCOUNTER — Other Ambulatory Visit: Payer: Self-pay

## 2019-01-28 ENCOUNTER — Ambulatory Visit
Admission: RE | Admit: 2019-01-28 | Discharge: 2019-01-28 | Disposition: A | Discharge status: Home / Self Care | Payer: PRIVATE HEALTH INSURANCE | Source: Ambulatory Visit | Attending: Family Medicine | Admitting: Family Medicine

## 2019-01-28 VITALS — BP 132/80 | HR 80 | Temp 98.9°F | Wt 145.4 lb

## 2019-01-28 DIAGNOSIS — S6991XA Unspecified injury of right wrist, hand and finger(s), initial encounter: Secondary | ICD-10-CM

## 2019-01-28 NOTE — Progress Notes (Signed)
Patient: Leslie Ruiz Female    DOB: 08/29/1966   53 y.o.   MRN: [id number] Visit Date: 01/28/2019  Today's Provider: Shirlee Latch, MD   Chief Complaint  Patient presents with  . Hand Injury   Subjective:     Hand Injury   The incident occurred more than 1 week ago. Incident location: horse back riding. Injury mechanism: Horse bucked and she grabbed the saddle horn to brace herself and all of her weight was forced onto that hand. The pain is present in the right hand (Index finger). The quality of the pain is described as aching and stabbing. The pain radiates to the right hand. The pain is at a severity of 6/10. The pain is mild. The pain has been intermittent since the incident. The symptoms are aggravated by movement and lifting. She has tried NSAIDs and acetaminophen (trying not to use hand as much) for the symptoms. The treatment provided mild relief.   R hand dominant  Allergies  Allergen Reactions  . Codeine Itching    Can take cough syrup   . Gluten Meal   . Morphine And Related Nausea And Vomiting     Current Outpatient Medications:  .  diphenhydrAMINE (BENADRYL ALLERGY) 25 MG tablet, Take 25 mg by mouth at bedtime as needed., Disp: , Rfl:  .  levonorgestrel-ethinyl estradiol (AVIANE) 0.1-20 MG-MCG tablet, Take 1 tablet by mouth daily., Disp: 1 Package, Rfl: 11 .  Melatonin 10 MG TABS, Take 10 mg by mouth at bedtime., Disp: , Rfl:  .  naproxen sodium (ALEVE) 220 MG tablet, Take 220 mg by mouth at bedtime., Disp: , Rfl:  .  NONFORMULARY OR COMPOUNDED ITEM, Estriol 1 mg/gram vaginal cream 1/4 applicator vaginally nightly for two weeks, every other night for two weeks, then twice a week thereafter, Disp: , Rfl:  .  omeprazole (PRILOSEC) 20 MG capsule, Take 20 mg by mouth daily., Disp: , Rfl:  .  propranolol ER (INDERAL LA) 60 MG 24 hr capsule, Take 1 capsule (60 mg total) by mouth daily., Disp: 90 capsule, Rfl: 3  Review of Systems  Constitutional: Negative.     HENT: Negative.   Eyes: Negative.   Respiratory: Negative.   Cardiovascular: Negative.   Gastrointestinal: Negative.   Endocrine: Negative.   Genitourinary: Negative.   Musculoskeletal: Negative.   Allergic/Immunologic: Negative.   Neurological: Negative.   Hematological: Negative.   Psychiatric/Behavioral: Negative.     Social History   Tobacco Use  . Smoking status: Never Smoker  . Smokeless tobacco: Never Used  Substance Use Topics  . Alcohol use: Yes    Alcohol/week: 0.0 standard drinks    Comment: rare      Objective:   BP 132/80 (BP Location: Left Arm, Patient Position: Sitting, Cuff Size: Normal)   Pulse 80   Temp 98.9 F (37.2 C) (Oral)   Wt 145 lb 6.4 oz (66 kg)   SpO2 97%   BMI 24.20 kg/m  Vitals:   01/28/19 0949  BP: 132/80  Pulse: 80  Temp: 98.9 F (37.2 C)  TempSrc: Oral  SpO2: 97%  Weight: 145 lb 6.4 oz (66 kg)     Physical Exam Vitals signs reviewed.  Constitutional:      General: She is not in acute distress.    Appearance: Normal appearance. She is not diaphoretic.  HENT:     Head: Normocephalic and atraumatic.  Eyes:     General: No scleral icterus.    Conjunctiva/sclera:  Conjunctivae normal.  Cardiovascular:     Rate and Rhythm: Normal rate and regular rhythm.  Pulmonary:     Effort: Pulmonary effort is normal. No respiratory distress.  Musculoskeletal:     Right lower leg: No edema.     Left lower leg: No edema.     Comments: R hand: Swelling and TTP along index finger MCP ROM intact of all joints and thumb.  Skin:    General: Skin is warm and dry.     Capillary Refill: Capillary refill takes less than 2 seconds.     Findings: No rash.  Neurological:     Mental Status: She is alert and oriented to person, place, and time. Mental status is at baseline.     Sensory: No sensory deficit.     Motor: No weakness.  Psychiatric:        Mood and Affect: Mood normal.        Behavior: Behavior normal.         Assessment &  Plan   1. Injury of right hand, initial encounter - injury where R hand was crushed between saddle horn and patient's full body weight 2 weeks ago with ongoing pain and swelling - concern for possible fracture of 1st metacarpal just proximal to MCP - will obtain XRay - advised RICE - may need hand surgery referral pending XRay results - DG Hand Complete Right; Future   Return if symptoms worsen or fail to improve.   The entirety of the information documented in the History of Present Illness, Review of Systems and Physical Exam were personally obtained by me. Portions of this information were initially documented by Presley Raddle and Benjie Karvonen, CMA and reviewed by me for thoroughness and accuracy.    Erasmo Downer, MD, MPH [facility] 01/28/2019 11:35 AM

## 2019-03-08 ENCOUNTER — Telehealth: Payer: Self-pay

## 2019-03-08 NOTE — Telephone Encounter (Signed)
I doubt it is gout given that It occurred after injury.  Is there a particular part that is swollen more?  Taking NSAIDs?  NSAIDs would also help gout by the way.  We could do an evisit to reevaluate.  She may need to see Orthopedics.

## 2019-03-08 NOTE — Telephone Encounter (Signed)
Patient states she was seen on 01/28/2019 for hand pain and swollen and it has not gotten any better. She states a x-ray has already been done and she thinking it might be gout. Patient would like to know if she needs to be seen again or what do you advise? Patient is aware that we are only doing e visit at the moment.

## 2019-03-09 NOTE — Telephone Encounter (Addendum)
Patient states the swelling is waxing and waning. She is taking Aleve BID. Patient scheduled for a e-visit on 03/10/2019 for re-evaluation.

## 2019-03-10 ENCOUNTER — Encounter: Payer: Self-pay | Admitting: Family Medicine

## 2019-03-10 ENCOUNTER — Ambulatory Visit (INDEPENDENT_AMBULATORY_CARE_PROVIDER_SITE_OTHER): Payer: PRIVATE HEALTH INSURANCE | Admitting: Family Medicine

## 2019-03-10 DIAGNOSIS — S6991XD Unspecified injury of right wrist, hand and finger(s), subsequent encounter: Secondary | ICD-10-CM

## 2019-03-10 DIAGNOSIS — M7989 Other specified soft tissue disorders: Secondary | ICD-10-CM | POA: Diagnosis not present

## 2019-03-10 NOTE — Patient Instructions (Signed)
Hand Contusion  A hand contusion is a deep bruise to the hand. Contusions are the result of a blunt injury to tissues and muscle fibers under the skin. The injury causes bleeding under the skin. The skin overlying the contusion may turn blue, purple, or yellow. Minor injuries will give you a painless contusion, but more severe contusions may stay painful and swollen for a few weeks.  What are the causes?  A contusion is usually caused by a hard hit, trauma, or direct force to your hand, such as having a heavy object fall on your hand.  What are the signs or symptoms?  Symptoms of this condition include:  · Swelling of the hand.  · Pain and tenderness of the hand.  · Discoloration of the hand. The area may have redness and then turn blue, purple, or yellow.  How is this diagnosed?  This condition is diagnosed from a physical exam and your medical history. An X-ray may be needed to see if there are any other injuries, such as broken bones (fractures). Sometimes, a CT scan or MRI may be needed if your health care provider is concerned that you may have torn or injured ligaments.  How is this treated?  An elastic wrap may be recommended to support your hand. In general, the best treatment for a hand contusion is rest, ice, pressure (compression), and elevation of the injured area. This is often called RICE therapy. Over-the-counter medicines may also be recommended for pain control.  Follow these instructions at home:  RICE Therapy  · Rest the injured area.  · If directed, apply ice to the injured area:  ? Put ice in a plastic bag.  ? Place a towel between your skin and the bag.  ? Leave the ice on for 20 minutes, 2-3 times a day.  · If directed, apply light compression to the injured area using an elastic wrap. Make sure the wrap is not too tight. Remove and reapply the wrap as told by your health care provider. If your fingers become numb, cold, or blue, take the wrap off and reapply it more loosely.  · Raise  (elevate) the injured area above the level of your heart while you are sitting or lying down.  General instructions    · Take over-the-counter and prescription medicines only as told by your health care provider.  · Protect your hand from getting injured further.  · Keep all follow-up visits as told by your health care provider. This is important.  Contact a health care provider if:  · Your symptoms do not improve after several days of treatment.  · You have increased redness, swelling, or pain in your hand or fingers.  · You have difficulty moving the injured area.  · Your swelling or pain is not relieved with medicines.  Get help right away if:  · You have severe pain.  · Your hand or fingers become numb.  · Your hand or fingers turn pale, blue, or cold.  · You cannot move your hand or wrist.  · Your hand is warm to the touch.  This information is not intended to replace advice given to you by your health care provider. Make sure you discuss any questions you have with your health care provider.  Document Released: 05/03/2002 Document Revised: 07/05/2016 Document Reviewed: 09/20/2015  Elsevier Interactive Patient Education © 2019 [employer]

## 2019-03-10 NOTE — Progress Notes (Addendum)
Patient: Leslie HammingYvonne E Ruiz Female    DOB: 10/03/1966   53 y.o.   MRN: [id number] Visit Date: 03/10/2019  Today's Provider: Shirlee LatchAngela Angelica Wix, MD   Chief Complaint  Patient presents with  . Hand swelling   Subjective:    Virtual Visit via Video Note  I connected with Leslie Ruiz on 03/10/19 at 10:00 AM EDT by a video enabled telemedicine application and verified that I am speaking with the correct person using two identifiers.   I discussed the limitations of evaluation and management by telemedicine and the availability of in person appointments. The patient expressed understanding and agreed to proceed.   Patient location: home [facility] Persons involved in the visit: patient, provider    HPI  Right Hand Injury:  Patient presents today to follow up. She was last seen on 01/28/2019.  XRay revealed no fracture. Patient reports the swelling in right hand is not improving. She has been using Aleve BID.   Pain differs by the day depending on activity.  Worse with using the house and typing as well as trying to ride her horse.  This initially occurred from being thrown into the saddle horn and R hand being caught between her body and saddle horn.  No redness, fevers.  Does have Dupuytrens contracture of L hand. No thickening felt in R palm by patient.   Allergies  Allergen Reactions  . Codeine Itching    Can take cough syrup   . Gluten Meal   . Morphine And Related Nausea And Vomiting     Current Outpatient Medications:  .  diphenhydrAMINE (BENADRYL ALLERGY) 25 MG tablet, Take 25 mg by mouth at bedtime as needed., Disp: , Rfl:  .  levonorgestrel-ethinyl estradiol (AVIANE) 0.1-20 MG-MCG tablet, Take 1 tablet by mouth daily., Disp: 1 Package, Rfl: 11 .  Melatonin 10 MG TABS, Take 10 mg by mouth at bedtime., Disp: , Rfl:  .  naproxen sodium (ALEVE) 220 MG tablet, Take 220 mg by mouth at bedtime., Disp: , Rfl:  .  NONFORMULARY OR  COMPOUNDED ITEM, Estriol 1 mg/gram vaginal cream 1/4 applicator vaginally nightly for two weeks, every other night for two weeks, then twice a week thereafter, Disp: , Rfl:  .  omeprazole (PRILOSEC) 20 MG capsule, Take 20 mg by mouth daily., Disp: , Rfl:  .  propranolol ER (INDERAL LA) 60 MG 24 hr capsule, Take 1 capsule (60 mg total) by mouth daily., Disp: 90 capsule, Rfl: 3  Review of Systems  Constitutional: Negative.   Respiratory: Negative.   Cardiovascular: Negative.   Musculoskeletal:       Hand pain and swelling     Social History   Tobacco Use  . Smoking status: Never Smoker  . Smokeless tobacco: Never Used  Substance Use Topics  . Alcohol use: Yes    Alcohol/week: 0.0 standard drinks    Comment: rare      Objective:   There were no vitals taken for this visit. There were no vitals filed for this visit.   Physical Exam Constitutional:      Appearance: Normal appearance.  Pulmonary:     Effort: Pulmonary effort is normal. No respiratory distress.  Musculoskeletal:     Comments: R hand: Noted swelling around 1st MCP joint.  ROM is intact, but seems painful for patient.  No erythema.  Skin:    Findings: No bruising, erythema or rash.  Neurological:     Mental Status:  She is alert and oriented to person, place, and time. Mental status is at baseline.  Psychiatric:        Mood and Affect: Mood normal.        Behavior: Behavior normal.         Assessment & Plan    I discussed the assessment and treatment plan with the patient. The patient was provided an opportunity to ask questions and all were answered. The patient agreed with the plan and demonstrated an understanding of the instructions.   The patient was advised to call back or seek an in-person evaluation if the symptoms worsen or if the condition fails to improve as anticipated.    1. Swelling of right index finger 2. Injury of right hand, subsequent encounter - ongoing pain and swelling of R 1st MCP  joint and surounding tissue from injury ~6 weeks ago - XRay early in ourse revealed no fractures - patient has been doing RICE and NSAIDs without relief - discussed that referral to Ortho would be appropriate for further eval and management - discussed icing and continuing Aleve - discussed return precautions - Ambulatory referral to Orthopedic Surgery   Return if symptoms worsen or fail to improve.   The entirety of the information documented in the History of Present Illness, Review of Systems and Physical Exam were personally obtained by me. Portions of this information were initially documented by Presley Raddle, CMA and reviewed by me for thoroughness and accuracy.    Erasmo Downer, MD, MPH [facility] 03/10/2019 10:14 AM

## 2019-03-12 ENCOUNTER — Telehealth: Payer: Self-pay

## 2019-03-12 NOTE — Telephone Encounter (Signed)
Pt was seen on e visit wed and she is checking back to see if a referral was done for her hand.  She is in a lot of pain in her hand and is swollen.  She feels she needs something ASAP.  She called [facility] but they havent received anything yet

## 2019-03-12 NOTE — Telephone Encounter (Signed)
Patient advised Ortho referral was sent to [facility] today and their office will contact her to schedule.

## 2019-03-12 NOTE — Telephone Encounter (Signed)
Can you check on the status of the referral? Thanks!

## 2019-04-26 ENCOUNTER — Ambulatory Visit (INDEPENDENT_AMBULATORY_CARE_PROVIDER_SITE_OTHER): Payer: PRIVATE HEALTH INSURANCE | Admitting: Family Medicine

## 2019-04-26 ENCOUNTER — Encounter: Payer: Self-pay | Admitting: Family Medicine

## 2019-04-26 ENCOUNTER — Other Ambulatory Visit: Payer: Self-pay

## 2019-04-26 VITALS — BP 123/72 | HR 87 | Temp 98.1°F | Wt 143.6 lb

## 2019-04-26 DIAGNOSIS — K219 Gastro-esophageal reflux disease without esophagitis: Secondary | ICD-10-CM

## 2019-04-26 DIAGNOSIS — Z841 Family history of disorders of kidney and ureter: Secondary | ICD-10-CM | POA: Diagnosis not present

## 2019-04-26 DIAGNOSIS — I491 Atrial premature depolarization: Secondary | ICD-10-CM

## 2019-04-26 DIAGNOSIS — Z0000 Encounter for general adult medical examination without abnormal findings: Secondary | ICD-10-CM

## 2019-04-26 DIAGNOSIS — Z1239 Encounter for other screening for malignant neoplasm of breast: Secondary | ICD-10-CM | POA: Diagnosis not present

## 2019-04-26 DIAGNOSIS — R768 Other specified abnormal immunological findings in serum: Secondary | ICD-10-CM

## 2019-04-26 NOTE — Assessment & Plan Note (Signed)
Continue to monitor renal function every 6 to 12 months Avoid nephrotoxic medications

## 2019-04-26 NOTE — Assessment & Plan Note (Signed)
Chronic and stable  Continue propranolol

## 2019-04-26 NOTE — Progress Notes (Signed)
Patient: Leslie Ruiz, Female    DOB: [birth year], 53 y.o.   MRN: [id number] Visit Date: 04/26/2019  Today's Provider: Shirlee Latch, MD   Chief Complaint  Patient presents with   Annual Exam   Subjective:     Annual physical exam Leslie Ruiz is a 53 y.o. female who presents today for health maintenance and complete physical. She feels well. She reports exercising twice a week. She reports she is sleeping well.   Sees [facility] for pap smears   Saw Hand surgery for tendonitis and acquired trigger finger after horseback riding accident - doing better but not totally back to normal. -----------------------------------------------------------------   Review of Systems  Constitutional: Negative.   HENT: Negative.   Eyes: Negative.   Respiratory: Negative.   Cardiovascular: Negative.   Gastrointestinal: Negative.   Endocrine: Negative.   Genitourinary: Negative.   Musculoskeletal: Negative.   Skin: Negative.   Allergic/Immunologic: Negative.   Neurological: Negative.   Hematological: Negative.   Psychiatric/Behavioral: Negative.     Social History      She  reports that she has never smoked. She has never used smokeless tobacco. She reports current alcohol use. She reports that she does not use drugs.       Social History   Socioeconomic History   Marital status: Married    Spouse name: Rocky Link   Number of children: 1   Years of education: 16   Highest education level: Bachelor's degree (e.g., BA, AB, BS)  Occupational History   Occupation: Heritage manager: OTHER    Comment: [employer]  Social Network engineer strain: Not hard at all   Food insecurity:    Worry: Never true    Inability: Never true   Transportation needs:    Medical: No    Non-medical: No  Tobacco Use   Smoking status: Never Smoker   Smokeless tobacco: Never Used  Substance and Sexual Activity   Alcohol use: Yes    Alcohol/week: 0.0  standard drinks    Comment: rare   Drug use: No   Sexual activity: Yes    Partners: Male    Birth control/protection: OCP  Lifestyle   Physical activity:    Days per week: 0 days    Minutes per session: 0 min   Stress: Not on file  Relationships   Social connections:    Talks on phone: Not on file    Gets together: Not on file    Attends religious service: Not on file    Active member of club or organization: Not on file    Attends meetings of clubs or organizations: Not on file    Relationship status: Not on file  Other Topics Concern   Not on file  Social History Narrative   Not on file    Past Medical History:  Diagnosis Date   Dupuytren's contracture of left hand    Gluten intolerance      Patient Active Problem List   Diagnosis Date Noted   Premature atrial contractions 01/30/2018   GERD (gastroesophageal reflux disease) 01/20/2018   Throat clearing 02/27/2017   Heart palpitations 11/13/2015   Adjustment disorder with mixed anxiety and depressed mood 11/13/2015   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   Dupuytren's contracture 10/25/2013    Past Surgical History:  Procedure Laterality Date   CESAREAN SECTION      Family History  Family Status  Relation Name Status   Mother  Deceased   Father  Alive   Sister  Alive   Mat Uncle  Deceased   Brother  Alive   PGM  (Not Specified)   PGF  (Not Specified)   MGM  (Not Specified)   MGF  (Not Specified)   Neg Hx  (Not Specified)        Her family history includes Alzheimer's disease in her mother; Arthritis in her father, mother, and sister; COPD in her father and mother; Cancer in her mother; Celiac disease in her sister; Healthy in her brother; Heart disease in her father, paternal grandfather, and paternal grandmother; Hypertension in her mother, paternal grandfather, and paternal grandmother; Kidney disease in her mother and sister; Kidney disease (age of onset: 71) in her  maternal uncle; Lung cancer in her father; Stroke in her maternal grandfather and maternal grandmother. There is no history of Colon cancer, Breast cancer, Ovarian cancer, Cervical cancer, or Drug abuse.      Allergies  Allergen Reactions   Codeine Itching    Can take cough syrup    Gluten Meal    Morphine And Related Nausea And Vomiting     Current Outpatient Medications:    diphenhydrAMINE (BENADRYL ALLERGY) 25 MG tablet, Take 25 mg by mouth at bedtime as needed., Disp: , Rfl:    levonorgestrel-ethinyl estradiol (AVIANE) 0.1-20 MG-MCG tablet, Take 1 tablet by mouth daily., Disp: 1 Package, Rfl: 11   Melatonin 10 MG TABS, Take 10 mg by mouth at bedtime., Disp: , Rfl:    naproxen sodium (ALEVE) 220 MG tablet, Take 220 mg by mouth at bedtime., Disp: , Rfl:    NONFORMULARY OR COMPOUNDED ITEM, Estriol 1 mg/gram vaginal cream 1/4 applicator vaginally nightly for two weeks, every other night for two weeks, then twice a week thereafter, Disp: , Rfl:    omeprazole (PRILOSEC) 20 MG capsule, Take 20 mg by mouth daily., Disp: , Rfl:    propranolol ER (INDERAL LA) 60 MG 24 hr capsule, Take 1 capsule (60 mg total) by mouth daily., Disp: 90 capsule, Rfl: 3   Patient Care Team: Erasmo Downer, MD as PCP - General (Family Medicine)    Objective:    Vitals: BP 123/72 (BP Location: Left Arm, Patient Position: Sitting, Cuff Size: Normal)    Pulse 87    Temp 98.1 F (36.7 C) (Oral)    Wt 143 lb 9.6 oz (65.1 kg)    SpO2 99%    BMI 23.90 kg/m    Vitals:   04/26/19 0902  BP: 123/72  Pulse: 87  Temp: 98.1 F (36.7 C)  TempSrc: Oral  SpO2: 99%  Weight: 143 lb 9.6 oz (65.1 kg)     Physical Exam Vitals signs reviewed.  Constitutional:      General: She is not in acute distress.    Appearance: Normal appearance. She is well-developed. She is not diaphoretic.  HENT:     Head: Normocephalic and atraumatic.     Right Ear: External ear normal.     Left Ear: External ear normal.    Eyes:     General: No scleral icterus.    Conjunctiva/sclera: Conjunctivae normal.     Pupils: Pupils are equal, round, and reactive to light.  Neck:     Musculoskeletal: Neck supple.     Thyroid: No thyromegaly.  Cardiovascular:     Rate and Rhythm: Normal rate and regular rhythm.     Heart sounds: Normal  heart sounds. No murmur.  Pulmonary:     Effort: Pulmonary effort is normal. No respiratory distress.     Breath sounds: Normal breath sounds. No wheezing or rales.  Abdominal:     General: There is no distension.     Palpations: Abdomen is soft.     Tenderness: There is no abdominal tenderness.  Musculoskeletal:     Right lower leg: No edema.     Left lower leg: No edema.  Lymphadenopathy:     Cervical: No cervical adenopathy.  Skin:    General: Skin is warm and dry.     Capillary Refill: Capillary refill takes less than 2 seconds.     Findings: No rash.  Neurological:     Mental Status: She is alert and oriented to person, place, and time. Mental status is at baseline.  Psychiatric:        Mood and Affect: Mood normal.        Behavior: Behavior normal.        Thought Content: Thought content normal.      Depression Screen PHQ 2/9 Scores 04/26/2019 04/26/2019 03/31/2019 04/22/2018  PHQ - 2 Score 0 0 0 0  PHQ- 9 Score 2 - - -       Assessment & Plan:     Routine Health Maintenance and Physical Exam  Exercise Activities and Dietary recommendations Goals   None     Immunization History  Administered Date(s) Administered   Influenza Inj Mdck Quad Pf 12/10/2018   Influenza Split 09/25/2012   Influenza,inj,Quad PF,6+ Mos 09/30/2013, 09/30/2014, 11/13/2015, 11/29/2016   Tdap 09/25/2012    Health Maintenance  Topic Date Due   PAP SMEAR-Modifier  02/05/2019   MAMMOGRAM  03/27/2019   INFLUENZA VACCINE  06/26/2019   TETANUS/TDAP  09/25/2022   COLONOSCOPY  01/13/2025   Hepatitis C Screening  Completed   HIV Screening  Completed     Discussed  health benefits of physical activity, and encouraged her to engage in regular exercise appropriate for her age and condition.    --------------------------------------------------------------------  Problem List Items Addressed This Visit      Cardiovascular and Mediastinum   Premature atrial contractions    Chronic and stable Continue propranolol        Digestive   GERD (gastroesophageal reflux disease)    Well-controlled with PPI Continue omeprazole      Relevant Orders   CBC w/Diff/Platelet     Other   Anti-TPO antibodies present    Continue to monitor TSH every 6 to 12 months      Relevant Orders   TSH   Family history of kidney disease    Continue to monitor renal function every 6 to 12 months Avoid nephrotoxic medications      Relevant Orders   Comprehensive metabolic panel    Other Visit Diagnoses    Encounter for annual physical exam    -  Primary   Relevant Orders   MM 3D SCREEN BREAST BILATERAL   TSH   Comprehensive metabolic panel   Lipid panel   CBC w/Diff/Platelet   Screening for breast cancer       Relevant Orders   MM 3D SCREEN BREAST BILATERAL       Return in about 6 months (around 10/26/2019) for chronic disease f/u.   The entirety of the information documented in the History of Present Illness, Review of Systems and Physical Exam were personally obtained by me. Portions of this information were initially documented by  Lexine Baton, LPN and reviewed by me for thoroughness and accuracy.    Avianna Moynahan, Marzella Schlein, MD MPH [facility]

## 2019-04-26 NOTE — Patient Instructions (Signed)
Preventive Care 40-64 Years, Female Preventive care refers to lifestyle choices and visits with your health care provider that can promote health and wellness. What does preventive care include?   A yearly physical exam. This is also called an annual well check.  Dental exams once or twice a year.  Routine eye exams. Ask your health care provider how often you should have your eyes checked.  Personal lifestyle choices, including: ? Daily care of your teeth and gums. ? Regular physical activity. ? Eating a healthy diet. ? Avoiding tobacco and drug use. ? Limiting alcohol use. ? Practicing safe sex. ? Taking low-dose aspirin daily starting at age 50. ? Taking vitamin and mineral supplements as recommended by your health care provider. What happens during an annual well check? The services and screenings done by your health care provider during your annual well check will depend on your age, overall health, lifestyle risk factors, and family history of disease. Counseling Your health care provider may ask you questions about your:  Alcohol use.  Tobacco use.  Drug use.  Emotional well-being.  Home and relationship well-being.  Sexual activity.  Eating habits.  Work and work environment.  Method of birth control.  Menstrual cycle.  Pregnancy history. Screening You may have the following tests or measurements:  Height, weight, and BMI.  Blood pressure.  Lipid and cholesterol levels. These may be checked every 5 years, or more frequently if you are over 50 years old.  Skin check.  Lung cancer screening. You may have this screening every year starting at age 55 if you have a 30-pack-year history of smoking and currently smoke or have quit within the past 15 years.  Colorectal cancer screening. All adults should have this screening starting at age 50 and continuing until age 75. Your health care provider may recommend screening at age 45. You will have tests every  1-10 years, depending on your results and the type of screening test. People at increased risk should start screening at an earlier age. Screening tests may include: ? Guaiac-based fecal occult blood testing. ? Fecal immunochemical test (FIT). ? Stool DNA test. ? Virtual colonoscopy. ? Sigmoidoscopy. During this test, a flexible tube with a tiny camera (sigmoidoscope) is used to examine your rectum and lower colon. The sigmoidoscope is inserted through your anus into your rectum and lower colon. ? Colonoscopy. During this test, a long, thin, flexible tube with a tiny camera (colonoscope) is used to examine your entire colon and rectum.  Hepatitis C blood test.  Hepatitis B blood test.  Sexually transmitted disease (STD) testing.  Diabetes screening. This is done by checking your blood sugar (glucose) after you have not eaten for a while (fasting). You may have this done every 1-3 years.  Mammogram. This may be done every 1-2 years. Talk to your health care provider about when you should start having regular mammograms. This may depend on whether you have a family history of breast cancer.  BRCA-related cancer screening. This may be done if you have a family history of breast, ovarian, tubal, or peritoneal cancers.  Pelvic exam and Pap test. This may be done every 3 years starting at age 21. Starting at age 30, this may be done every 5 years if you have a Pap test in combination with an HPV test.  Bone density scan. This is done to screen for osteoporosis. You may have this scan if you are at high risk for osteoporosis. Discuss your test results, treatment options,   and if necessary, the need for more tests with your health care provider. Vaccines Your health care provider may recommend certain vaccines, such as:  Influenza vaccine. This is recommended every year.  Tetanus, diphtheria, and acellular pertussis (Tdap, Td) vaccine. You may need a Td booster every 10 years.  Varicella  vaccine. You may need this if you have not been vaccinated.  Zoster vaccine. You may need this after age 38.  Measles, mumps, and rubella (MMR) vaccine. You may need at least one dose of MMR if you were born in 1957 or later. You may also need a second dose.  Pneumococcal 13-valent conjugate (PCV13) vaccine. You may need this if you have certain conditions and were not previously vaccinated.  Pneumococcal polysaccharide (PPSV23) vaccine. You may need one or two doses if you smoke cigarettes or if you have certain conditions.  Meningococcal vaccine. You may need this if you have certain conditions.  Hepatitis A vaccine. You may need this if you have certain conditions or if you travel or work in places where you may be exposed to hepatitis A.  Hepatitis B vaccine. You may need this if you have certain conditions or if you travel or work in places where you may be exposed to hepatitis B.  Haemophilus influenzae type b (Hib) vaccine. You may need this if you have certain conditions. Talk to your health care provider about which screenings and vaccines you need and how often you need them. This information is not intended to replace advice given to you by your health care provider. Make sure you discuss any questions you have with your health care provider. Document Released: 12/08/2015 Document Revised: 01/01/2018 Document Reviewed: 09/12/2015 Elsevier Interactive Patient Education  2019 [employer].

## 2019-04-26 NOTE — Assessment & Plan Note (Signed)
Well-controlled with PPI Continue omeprazole

## 2019-04-26 NOTE — Assessment & Plan Note (Signed)
Continue to monitor TSH every 6 to 12 months

## 2019-04-27 ENCOUNTER — Encounter: Payer: Self-pay | Admitting: Family Medicine

## 2019-04-27 LAB — COMPREHENSIVE METABOLIC PANEL
ALT: 28 IU/L (ref 0–32)
AST: 26 IU/L (ref 0–40)
Albumin/Globulin Ratio: 1.8 (ref 1.2–2.2)
Albumin: 4.4 g/dL (ref 3.8–4.9)
Alkaline Phosphatase: 76 IU/L (ref 39–117)
BUN/Creatinine Ratio: 15 (ref 9–23)
BUN: 15 mg/dL (ref 6–24)
Bilirubin Total: 0.2 mg/dL (ref 0.0–1.2)
CO2: 27 mmol/L (ref 20–29)
Calcium: 9.6 mg/dL (ref 8.7–10.2)
Chloride: 99 mmol/L (ref 96–106)
Creatinine, Ser: 0.97 mg/dL (ref 0.57–1.00)
GFR calc Af Amer: 77 mL/min/{1.73_m2} (ref >59)
GFR calc non Af Amer: 66 mL/min/{1.73_m2} (ref >59)
Globulin, Total: 2.5 g/dL (ref 1.5–4.5)
Glucose: 69 mg/dL (ref 65–99)
Potassium: 4.3 mmol/L (ref 3.5–5.2)
Sodium: 139 mmol/L (ref 134–144)
Total Protein: 6.9 g/dL (ref 6.0–8.5)

## 2019-04-27 LAB — CBC WITH DIFFERENTIAL/PLATELET
Basophils Absolute: 0.1 10*3/uL (ref 0.0–0.2)
Basos: 1 %
EOS (ABSOLUTE): 0.2 10*3/uL (ref 0.0–0.4)
Eos: 2 %
Hematocrit: 38.6 % (ref 34.0–46.6)
Hemoglobin: 12.8 g/dL (ref 11.1–15.9)
Immature Grans (Abs): 0 10*3/uL (ref 0.0–0.1)
Immature Granulocytes: 0 %
Lymphocytes Absolute: 1.2 10*3/uL (ref 0.7–3.1)
Lymphs: 12 %
MCH: 29.6 pg (ref 26.6–33.0)
MCHC: 33.2 g/dL (ref 31.5–35.7)
MCV: 89 fL (ref 79–97)
Monocytes Absolute: 0.5 10*3/uL (ref 0.1–0.9)
Monocytes: 5 %
Neutrophils Absolute: 8.3 10*3/uL — ABNORMAL HIGH (ref 1.4–7.0)
Neutrophils: 80 %
Platelets: 312 10*3/uL (ref 150–450)
RBC: 4.33 x10E6/uL (ref 3.77–5.28)
RDW: 12.7 % (ref 11.7–15.4)
WBC: 10.2 10*3/uL (ref 3.4–10.8)

## 2019-04-27 LAB — TSH: TSH: 2.1 u[IU]/mL (ref 0.450–4.500)

## 2019-04-27 LAB — LIPID PANEL
Chol/HDL Ratio: 4.2 ratio (ref 0.0–4.4)
Cholesterol, Total: 192 mg/dL (ref 100–199)
HDL: 46 mg/dL (ref >39)
LDL Calculated: 115 mg/dL — ABNORMAL HIGH (ref 0–99)
Triglycerides: 155 mg/dL — ABNORMAL HIGH (ref 0–149)
VLDL Cholesterol Cal: 31 mg/dL (ref 5–40)

## 2019-04-28 ENCOUNTER — Other Ambulatory Visit: Payer: Self-pay | Admitting: Family Medicine

## 2019-05-17 ENCOUNTER — Encounter: Payer: Self-pay | Admitting: Family Medicine

## 2019-10-07 ENCOUNTER — Ambulatory Visit
Admission: RE | Admit: 2019-10-07 | Discharge: 2019-10-07 | Disposition: A | Discharge status: Home / Self Care | Payer: PRIVATE HEALTH INSURANCE | Source: Ambulatory Visit | Attending: Family Medicine | Admitting: Family Medicine

## 2019-10-07 DIAGNOSIS — Z1239 Encounter for other screening for malignant neoplasm of breast: Secondary | ICD-10-CM

## 2019-10-07 DIAGNOSIS — Z0000 Encounter for general adult medical examination without abnormal findings: Secondary | ICD-10-CM | POA: Insufficient documentation

## 2019-10-07 DIAGNOSIS — Z1231 Encounter for screening mammogram for malignant neoplasm of breast: Secondary | ICD-10-CM | POA: Insufficient documentation

## 2019-10-08 ENCOUNTER — Other Ambulatory Visit: Payer: Self-pay | Admitting: Family Medicine

## 2019-10-08 DIAGNOSIS — R928 Other abnormal and inconclusive findings on diagnostic imaging of breast: Secondary | ICD-10-CM

## 2019-10-14 ENCOUNTER — Other Ambulatory Visit: Payer: Self-pay

## 2019-10-14 ENCOUNTER — Encounter: Payer: Self-pay | Admitting: Family Medicine

## 2019-10-14 ENCOUNTER — Ambulatory Visit (INDEPENDENT_AMBULATORY_CARE_PROVIDER_SITE_OTHER): Payer: PRIVATE HEALTH INSURANCE | Admitting: Family Medicine

## 2019-10-14 ENCOUNTER — Ambulatory Visit: Payer: Self-pay | Admitting: Family Medicine

## 2019-10-14 VITALS — BP 112/61 | HR 72 | Temp 97.6°F | Resp 16 | Ht 65.0 in | Wt 142.0 lb

## 2019-10-14 DIAGNOSIS — R768 Other specified abnormal immunological findings in serum: Secondary | ICD-10-CM | POA: Diagnosis not present

## 2019-10-14 DIAGNOSIS — K219 Gastro-esophageal reflux disease without esophagitis: Secondary | ICD-10-CM | POA: Diagnosis not present

## 2019-10-14 DIAGNOSIS — Z23 Encounter for immunization: Secondary | ICD-10-CM | POA: Diagnosis not present

## 2019-10-14 DIAGNOSIS — Z79899 Other long term (current) drug therapy: Secondary | ICD-10-CM | POA: Diagnosis not present

## 2019-10-14 DIAGNOSIS — Z841 Family history of disorders of kidney and ureter: Secondary | ICD-10-CM

## 2019-10-14 DIAGNOSIS — I491 Atrial premature depolarization: Secondary | ICD-10-CM | POA: Diagnosis not present

## 2019-10-14 MED ORDER — METOPROLOL SUCCINATE ER 50 MG PO TB24: 50.0000 mg | ORAL_TABLET | Freq: Every day | ORAL | 3 refills | Status: DC

## 2019-10-14 NOTE — Assessment & Plan Note (Signed)
Continue to monitor renal function q6-12 months Avoid nephrotoxic meds Discussed risks of chronic PPIs

## 2019-10-14 NOTE — Progress Notes (Signed)
Patient: Leslie Ruiz Female    DO: 08-23-1966   53 y.o.   MRM: [id number] Visit Date: 10/14/2019  Today's Provider: Shirlee Latch, MD   Chief Complaint  Patient presents with  . Follow-up   Subjective:     HPI  Follow up for Anti-TPO antibodies present  The patient was last seen for this 5 months ago. Changes made at last visit include none; will continue to monitor TSH every 6 to 12 months .  She reports excellent compliance with treatment. She feels that condition is Unchanged. Patient reports she is having "heart racing" some times during the day but definitely every night.  She is not having side effects.   ------------------------------------------------------------------------------------   Follow up for GERD:  The patient was last seen for this 5 months ago. Changes made at last visit include none; well controlled on Omeprazole.  She reports excellent compliance with treatment. She feels that condition is Unchanged. She is not having side effects.  ------------------------------------------------------------------------------------  Allergies  Allergen Reactions  . Codeine Itching    Can take cough syrup   . Gluten Meal   . Morphine And Related Nausea And Vomiting     Current Outpatient Medications:  .  diphenhydrAMINE (BENADRYL ALLERGY) 25 MG tablet, Take 25 mg by mouth at bedtime as needed., Disp: , Rfl:  .  estradiol-norethindrone (ACTIVELLA) 1-0.5 MG tablet, Take by mouth., Disp: , Rfl:  .  Melatonin 10 MG TABS, Take 10 mg by mouth at bedtime., Disp: , Rfl:  .  naproxen sodium (ALEVE) 220 MG tablet, Take 220 mg by mouth at bedtime., Disp: , Rfl:  .  NONFORMULARY OR COMPOUNDED ITEM, Estriol 1 mg/gram vaginal cream 1/4 applicator vaginally nightly for two weeks, every other night for two weeks, then twice a week thereafter, Disp: , Rfl:  .  omeprazole (PRILOSEC) 20 MG capsule, Take 20 mg by mouth daily., Disp: , Rfl:  .  solifenacin  (VESICARE) 5 MG tablet, Take by mouth., Disp: , Rfl:  .  metoprolol succinate (TOPROL-XL) 50 MG 24 hr tablet, Take 1 tablet (50 mg total) by mouth daily., Disp: 30 tablet, Rfl: 3  Review of Systems  Constitutional: Negative.   Respiratory: Negative.   Cardiovascular: Positive for palpitations.  Endocrine: Negative.     Social History   Tobacco Use  . Smoking status: Never Smoker  . Smokeless tobacco: Never Used  Substance Use Topics  . Alcohol use: Yes    Alcohol/week: 0.0 standard drinks    Comment: rare      Objective:   BP 112/61 (BP Location: Left Arm, Patient Position: Sitting, Cuff Size: Normal)   Pulse 72   Temp 97.6 F (36.4 C) (Temporal)   Resp 16   Ht 5\' 5"  (1.651 m)   Wt 142 lb (64.4 kg)   BMI 23.63 kg/m  Vitals:   10/14/19 1128  BP: 112/61  Pulse: 72  Resp: 16  Temp: 97.6 F (36.4 C)  TempSrc: Temporal  Weight: 142 lb (64.4 kg)  Height: 5\' 5"  (1.651 m)  Body mass index is 23.63 kg/m.   Physical Exam Vitals signs reviewed.  Constitutional:      General: She is not in acute distress.    Appearance: Normal appearance. She is well-developed. She is not diaphoretic.  HENT:     Head: Normocephalic and atraumatic.  Eyes:     General: No scleral icterus.    Conjunctiva/sclera: Conjunctivae normal.  Neck:  Musculoskeletal: Neck supple.     Thyroid: No thyromegaly.  Cardiovascular:     Rate and Rhythm: Normal rate and regular rhythm.     Pulses: Normal pulses.     Heart sounds: Normal heart sounds. No murmur.  Pulmonary:     Effort: Pulmonary effort is normal. No respiratory distress.     Breath sounds: Normal breath sounds. No wheezing, rhonchi or rales.  Musculoskeletal:     Right lower leg: No edema.     Left lower leg: No edema.  Lymphadenopathy:     Cervical: No cervical adenopathy.  Skin:    General: Skin is warm and dry.     Capillary Refill: Capillary refill takes less than 2 seconds.     Findings: No rash.  Neurological:      Mental Status: She is alert and oriented to person, place, and time. Mental status is at baseline.  Psychiatric:        Mood and Affect: Mood normal.        Behavior: Behavior normal.      No results found for any visits on 10/14/19.     Assessment & Plan   Problem List Items Addressed This Visit      Cardiovascular and Mediastinum   Premature atrial contractions    Chronic with some worsening Will switch propranolol to Metoprolol XL 50mg  daily      Relevant Medications   metoprolol succinate (TOPROL-XL) 50 MG 24 hr tablet   Other Relevant Orders   TSH + free T4   Z61   Basic Metabolic Panel (BMET)     Digestive   GERD (gastroesophageal reflux disease)    Chronic, well controlled Continue PPI Check BMP and B12 levels      Relevant Orders   B12     Other   Anti-TPO antibodies present - Primary    Recheck TSH, T4, TPO abs Asymptomatic other than palpitations related to PACs      Relevant Orders   TSH + free T4   Thyroid Peroxidase Antibodies (TPO) (REFL)   Family history of kidney disease    Continue to monitor renal function q6-12 months Avoid nephrotoxic meds Discussed risks of chronic PPIs      Relevant Orders   Basic Metabolic Panel (BMET)    Other Visit Diagnoses    Long-term use of high-risk medication       Relevant Orders   B12       Return in about 6 months (around 04/12/2020) for CPE.   The entirety of the information documented in the History of Present Illness, Review of Systems and Physical Exam were personally obtained by me. Portions of this information were initially documented by Lynford Humphrey, CMA and reviewed by me for thoroughness and accuracy.    Shanisha Lech, Dionne Bucy, MD MPH [facility]

## 2019-10-14 NOTE — Assessment & Plan Note (Addendum)
Chronic, well controlled Continue PPI Check BMP and B12 levels

## 2019-10-14 NOTE — Assessment & Plan Note (Addendum)
Chronic with some worsening Will switch propranolol to Metoprolol XL 50mg  daily

## 2019-10-14 NOTE — Assessment & Plan Note (Signed)
Recheck TSH, T4, TPO abs Asymptomatic other than palpitations related to PACs

## 2019-10-14 NOTE — Addendum Note (Signed)
Addended by: Virginia Crews on: 10/14/2019 12:16 PM   Modules accepted: Orders

## 2019-10-14 NOTE — Addendum Note (Signed)
Addended by: Shawna Orleans on: 10/14/2019 02:41 PM   Modules accepted: Orders

## 2019-10-15 ENCOUNTER — Telehealth: Payer: Self-pay

## 2019-10-15 LAB — BASIC METABOLIC PANEL
BUN/Creatinine Ratio: 22 (ref 9–23)
BUN: 16 mg/dL (ref 6–24)
CO2: 24 mmol/L (ref 20–29)
Calcium: 9.7 mg/dL (ref 8.7–10.2)
Chloride: 100 mmol/L (ref 96–106)
Creatinine, Ser: 0.73 mg/dL (ref 0.57–1.00)
GFR calc Af Amer: 107 mL/min/{1.73_m2} (ref >59)
GFR calc non Af Amer: 93 mL/min/{1.73_m2} (ref >59)
Glucose: 87 mg/dL (ref 65–99)
Potassium: 4.5 mmol/L (ref 3.5–5.2)
Sodium: 138 mmol/L (ref 134–144)

## 2019-10-15 LAB — VITAMIN B12: Vitamin B-12: 908 pg/mL (ref 232–1245)

## 2019-10-15 LAB — TSH+FREE T4
Free T4: 1.36 ng/dL (ref 0.82–1.77)
TSH: 2.44 u[IU]/mL (ref 0.450–4.500)

## 2019-10-15 NOTE — Telephone Encounter (Signed)
Left message advising pt (per DPR).   Thanks,   -Tillman Kazmierski  

## 2019-10-15 NOTE — Telephone Encounter (Signed)
-----   Message from Virginia Crews, MD sent at 10/15/2019 10:50 AM EST ----- Normal labs.  Can see cardiology again for tachycardia if she wants.

## 2019-10-18 ENCOUNTER — Ambulatory Visit
Admission: RE | Admit: 2019-10-18 | Discharge: 2019-10-18 | Disposition: A | Discharge status: Home / Self Care | Payer: PRIVATE HEALTH INSURANCE | Source: Ambulatory Visit | Attending: Family Medicine | Admitting: Family Medicine

## 2019-10-18 DIAGNOSIS — R928 Other abnormal and inconclusive findings on diagnostic imaging of breast: Secondary | ICD-10-CM

## 2019-10-25 ENCOUNTER — Encounter: Payer: Self-pay | Admitting: Family Medicine

## 2019-10-26 MED ORDER — CITALOPRAM HYDROBROMIDE 10 MG PO TABS: 10.0000 mg | ORAL_TABLET | Freq: Every day | ORAL | 3 refills | Status: DC

## 2019-10-27 ENCOUNTER — Ambulatory Visit: Payer: Self-pay | Admitting: Family Medicine

## 2020-02-28 ENCOUNTER — Ambulatory Visit (INDEPENDENT_AMBULATORY_CARE_PROVIDER_SITE_OTHER): Payer: PRIVATE HEALTH INSURANCE | Admitting: Family Medicine

## 2020-02-28 ENCOUNTER — Ambulatory Visit: Payer: PRIVATE HEALTH INSURANCE | Attending: Internal Medicine

## 2020-02-28 ENCOUNTER — Encounter: Payer: Self-pay | Admitting: Family Medicine

## 2020-02-28 ENCOUNTER — Other Ambulatory Visit: Payer: Self-pay

## 2020-02-28 VITALS — BP 115/78 | HR 69 | Temp 96.9°F | Wt 145.0 lb

## 2020-02-28 DIAGNOSIS — M720 Palmar fascial fibromatosis [Dupuytren]: Secondary | ICD-10-CM | POA: Diagnosis not present

## 2020-02-28 DIAGNOSIS — S46811A Strain of other muscles, fascia and tendons at shoulder and upper arm level, right arm, initial encounter: Secondary | ICD-10-CM

## 2020-02-28 DIAGNOSIS — Z23 Encounter for immunization: Secondary | ICD-10-CM

## 2020-02-28 MED ORDER — MELOXICAM 15 MG PO TABS: 15.0000 mg | ORAL_TABLET | Freq: Every day | ORAL | 0 refills | Status: DC

## 2020-02-28 MED ORDER — CYCLOBENZAPRINE HCL 5 MG PO TABS: 5.0000 mg | ORAL_TABLET | Freq: Three times a day (TID) | ORAL | 1 refills | Status: DC | PRN

## 2020-02-28 NOTE — Progress Notes (Signed)
Established patient visit      Patient: Leslie Ruiz   DOB: [birth year]   54 y.o. Female  MRN: [id number] Visit Date: 02/28/2020  Today's healthcare provider: Shirlee Latch, MD  Subjective:    Chief Complaint  Patient presents with  . Neck Pain   HPI Neck Pain  This is a chronic (Pt reports the pain started months ago.) problem. The problem has been gradually worsening. The pain is associated with nothing. The pain is present in the right side. The quality of the pain is described as aching. The symptoms are aggravated by twisting. Stiffness is present at night. Associated symptoms include headaches (Frequent headaches since having Covid in January.). She has tried acetaminophen, NSAIDs and home exercises for the symptoms. The treatment provided moderate relief.   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 Smoking status: Never Smoker  . Smokeless tobacco: Never Used  Substance Use Topics  . Alcohol use: Yes    Alcohol/week: 0.0 standard drinks    Comment: rare  . Drug use: No   Social History   Socioeconomic History  . Marital status: Married    Spouse name: Rocky Link  . Number of children: 1  . Years of education: 64  . Highest education level: Bachelor's degree (e.g., BA, AB, BS)  Occupational History  . Occupation: Heritage manager: OTHER    Comment: [employer]  Tobacco Use  . Smoking status:  Never Smoker  . Smokeless tobacco: Never Used  Substance and Sexual Activity  . Alcohol use: Yes    Alcohol/week: 0.0 standard drinks    Comment: rare  . Drug use: No  . Sexual activity: Yes    Partners: Male    Birth control/protection: OCP  Other Topics Concern  . Not on file  Social History Narrative  . Not on file   Social Determinants of Health   Financial Resource Strain:   . Difficulty of Paying Living Expenses:   Food Insecurity:   . Worried About Programme researcher, broadcasting/film/video in the Last Year:   . Barista in the Last Year:   Transportation Needs:   . Freight forwarder (Medical):   Marland Kitchen Lack of Transportation (Non-Medical):   Physical Activity:   . Days of Exercise per Week:   . Minutes of Exercise per Session:   Stress:   . Feeling of Stress :   Social Connections:   . Frequency of Communication with Friends and Family:   . Frequency of Social Gatherings with Friends and Family:   . Attends Religious Services:   . Active Member of Clubs or Organizations:   . Attends Banker Meetings:   Marland Kitchen Marital Status:   Intimate Partner Violence:   . Fear of Current or Ex-Partner:   . Emotionally Abused:   Marland Kitchen Physically Abused:   . Sexually Abused:  Medications: Outpatient Medications Prior to Visit  Medication Sig  . diphenhydrAMINE (BENADRYL ALLERGY) 25 MG tablet Take 25 mg by mouth at bedtime as needed.  Marland Kitchen estradiol-norethindrone (ACTIVELLA) 1-0.5 MG tablet Take by mouth.  . Melatonin 10 MG TABS Take 10 mg by mouth at bedtime.  . naproxen sodium (ALEVE) 220 MG tablet Take 220 mg by mouth at bedtime.  . NONFORMULARY OR COMPOUNDED ITEM Estriol 1 mg/gram vaginal cream 1/4 applicator vaginally nightly for two weeks, every other night for two weeks, then twice a week thereafter  . omeprazole (PRILOSEC) 20 MG capsule Take 20 mg by mouth daily.  . propranolol ER (INDERAL LA) 60 MG 24 hr capsule Take 60 mg by mouth daily.  . solifenacin (VESICARE) 5 MG  tablet Take by mouth.  . [DISCONTINUED] propranolol (INDERAL) 10 MG tablet Take 10 mg by mouth 3 (three) times daily.  . [DISCONTINUED] propranolol (INDERAL) 60 MG tablet Take 60 mg by mouth daily.  . citalopram (CELEXA) 10 MG tablet Take 1 tablet (10 mg total) by mouth daily. (Patient not taking: Reported on 02/28/2020)  . metoprolol succinate (TOPROL-XL) 50 MG 24 hr tablet Take 1 tablet (50 mg total) by mouth daily.   No facility-administered medications prior to visit.    Review of Systems  Constitutional: Negative.   HENT: Negative.   Eyes: Negative.   Respiratory: Negative.   Cardiovascular: Negative.   Gastrointestinal: Negative.   Endocrine: Negative.   Genitourinary: Negative.   Musculoskeletal: Positive for neck pain and neck stiffness.  Skin: Negative.   Allergic/Immunologic: Negative.   Neurological: Positive for headaches.       Headaches since having covid   Hematological: Negative.   Psychiatric/Behavioral: Negative.          Objective:    BP 115/78 (BP Location: Left Arm, Patient Position: Sitting, Cuff Size: Normal)   Pulse 69   Temp (!) 96.9 F (36.1 C) (Temporal)   Wt 145 lb (65.8 kg)   BMI 24.13 kg/m     Physical Exam Constitutional:      Appearance: Normal appearance.  HENT:     Head: Normocephalic and atraumatic.  Neck:     Comments: Pain on turning head to the right in the distribution of the trapezius  Trapezius is tight on palpation bilaterally Cardiovascular:     Rate and Rhythm: Normal rate and regular rhythm.  Pulmonary:     Effort: Pulmonary effort is normal.     Breath sounds: Normal breath sounds.  Musculoskeletal:     Cervical back: Normal range of motion and neck supple. Tenderness present.     Comments: Nodules are palpated on the palmar side bilaterally 3rd and 4th digits are contracted  Skin:    General: Skin is warm and dry.  Neurological:     General: No focal deficit present.     Mental Status: She is alert and oriented  to person, place, and time.  Psychiatric:        Mood and Affect: Mood normal.      No results found for any visits on 02/28/20.    Assessment & Plan:    Problem List Items Addressed This Visit      Musculoskeletal and Integument   Trapezius muscle strain, right, initial encounter - Primary    Pt with 3 month history of right neck pain first seen today Symptoms are most consistent with trapezius strain   Plan: Discussed with pt use of ice packs and rest to help symptoms Recommend  starting meloxicam for pain Recommend starting cyclobenzaprine for neck tightness        Other   Dupuytren's contracture    Pt with history of dupuytren's contracture Worsening symptoms over the last month  Plan: Referral to hand surgery       Relevant Orders   Ambulatory referral to Hand Surgery         Terrill Mohr, Medical Student [facility] of Medicine  [facility] [phone] (phone) [phone] (fax)  [facility]

## 2020-02-28 NOTE — Progress Notes (Signed)
   Covid-19 Vaccination Clinic  Name:  Leslie Ruiz    MRN: [id number] DOB: 12-05-1965  02/28/2020  [name] was observed post Covid-19 immunization for 15 minutes without incident. She was provided with Vaccine Information Sheet and instruction to access the V-Safe system.   [name] was instructed to call 911 with any severe reactions post vaccine: Marland Kitchen Difficulty breathing  . Swelling of face and throat  . A fast heartbeat  . A bad rash all over body  . Dizziness and weakness   Immunizations Administered    Name Date Dose VIS Date Route   Pfizer COVID-19 Vaccine 02/28/2020  1:17 PM 0.3 mL 11/05/2019 Intramuscular   Manufacturer: [employer], [employer]   Lot: [id number]   NDC: [address]-9

## 2020-02-28 NOTE — Assessment & Plan Note (Signed)
Pt with history of dupuytren's contracture Worsening symptoms over the last month  Plan: Referral to hand surgery

## 2020-02-28 NOTE — Assessment & Plan Note (Signed)
Pt with 3 month history of right neck pain first seen today Symptoms are most consistent with trapezius strain   Plan: Discussed with pt use of ice packs and rest to help symptoms Recommend starting meloxicam for pain Recommend starting cyclobenzaprine for neck tightness

## 2020-02-29 ENCOUNTER — Encounter: Payer: Self-pay | Admitting: Family Medicine

## 2020-03-15 ENCOUNTER — Encounter: Payer: Self-pay | Admitting: Family Medicine

## 2020-03-21 ENCOUNTER — Ambulatory Visit: Payer: PRIVATE HEALTH INSURANCE | Attending: Internal Medicine

## 2020-03-21 DIAGNOSIS — Z23 Encounter for immunization: Secondary | ICD-10-CM

## 2020-03-21 NOTE — Progress Notes (Signed)
   Covid-19 Vaccination Clinic  Name:  Leslie Ruiz    MRN: [id number] DOB: 1966/01/08  03/21/2020  Leslie Ruiz was observed post Covid-19 immunization for 15 minutes without incident. She was provided with Vaccine Information Sheet and instruction to access the V-Safe system.   Leslie Ruiz was instructed to call 911 with any severe reactions post vaccine: Marland Kitchen Difficulty breathing  . Swelling of face and throat  . A fast heartbeat  . A bad rash all over body  . Dizziness and weakness   Immunizations Administered    Name Date Dose VIS Date Route   Pfizer COVID-19 Vaccine 03/21/2020  3:08 PM 0.3 mL 01/19/2019 Intramuscular   Manufacturer: [employer], [employer]   Lot: [id number]   NDC: [address]-4

## 2020-03-28 ENCOUNTER — Telehealth: Payer: Self-pay

## 2020-03-28 NOTE — Telephone Encounter (Signed)
Copied from CRM [id number]. Topic: General - Inquiry >> Mar 28, 2020 11:52 AM Deborha Payment wrote: Reason for CRM: Patient is requesting a nurse to call back regarding medication  meloxicam (MOBIC) 15 MG tablet  Patient is stating she think it is causing dizziness. Call back [phone]

## 2020-03-28 NOTE — Telephone Encounter (Signed)
Patient reports that dizziness has stopped since not taking meloxicam since Saturday. Patient would like a similar medication to be sent to [facility] pharmacy in [place]. Patient also reports that her cyclobenzaprine is out and would like to refill. Patient reports that meloxicam worked well for shoulder and neck pain. Please advise.

## 2020-03-29 MED ORDER — NAPROXEN 500 MG PO TABS: 500.0000 mg | ORAL_TABLET | Freq: Two times a day (BID) | ORAL | 1 refills | Status: DC

## 2020-03-29 MED ORDER — CYCLOBENZAPRINE HCL 5 MG PO TABS: 5.0000 mg | ORAL_TABLET | Freq: Three times a day (TID) | ORAL | 1 refills | Status: DC | PRN

## 2020-03-29 NOTE — Telephone Encounter (Signed)
Pt advised.  Prescriptions sent to [facility] in [place],   Thanks,   -Vernona Rieger

## 2020-03-29 NOTE — Addendum Note (Signed)
Addended by: Kavin Leech E on: 03/29/2020 09:52 AM   Modules accepted: Orders

## 2020-03-29 NOTE — Telephone Encounter (Signed)
We can prescribe naproxen 500 mg twice daily #60 with 1 refill to use instead of meloxicam.  She should not take this with Aleve, ibuprofen, Advil, or other NSAIDs.  We can also refill her cyclobenzaprine.

## 2020-04-17 ENCOUNTER — Encounter: Payer: Self-pay | Admitting: Family Medicine

## 2020-04-19 ENCOUNTER — Other Ambulatory Visit: Payer: Self-pay

## 2020-04-19 ENCOUNTER — Encounter: Payer: Self-pay | Admitting: Family Medicine

## 2020-04-19 ENCOUNTER — Ambulatory Visit (INDEPENDENT_AMBULATORY_CARE_PROVIDER_SITE_OTHER): Payer: PRIVATE HEALTH INSURANCE | Admitting: Family Medicine

## 2020-04-19 VITALS — BP 117/79 | HR 76 | Temp 96.9°F | Ht 65.0 in | Wt 144.0 lb

## 2020-04-19 DIAGNOSIS — Z0000 Encounter for general adult medical examination without abnormal findings: Secondary | ICD-10-CM

## 2020-04-19 DIAGNOSIS — S46811A Strain of other muscles, fascia and tendons at shoulder and upper arm level, right arm, initial encounter: Secondary | ICD-10-CM | POA: Diagnosis not present

## 2020-04-19 DIAGNOSIS — Z1231 Encounter for screening mammogram for malignant neoplasm of breast: Secondary | ICD-10-CM

## 2020-04-19 MED ORDER — NAPROXEN 500 MG PO TABS: 500.0000 mg | ORAL_TABLET | Freq: Two times a day (BID) | ORAL | 1 refills | Status: DC

## 2020-04-19 MED ORDER — CYCLOBENZAPRINE HCL 5 MG PO TABS: 5.0000 mg | ORAL_TABLET | Freq: Three times a day (TID) | ORAL | 1 refills | Status: DC | PRN

## 2020-04-19 NOTE — Progress Notes (Signed)
I,Laura E Walsh,acting as a scribe for Shirlee Latch, MD.,have documented all relevant documentation on the behalf of Shirlee Latch, MD,as directed by  Shirlee Latch, MD while in the presence of Shirlee Latch, MD.   Complete physical exam   Patient: Leslie Ruiz   DOB: [birth year]   54 y.o. Female  MRN: [id number] Visit Date: 04/19/2020  Today's healthcare provider: Shirlee Latch, MD   Chief Complaint  Patient presents with  . Annual Exam   Subjective    Leslie Ruiz is a 54 y.o. female who presents today for a complete physical exam.  She reports consuming a general diet. Pt reports exercising some.  She generally feels well. She reports sleeping well. She does have additional problems to discuss today.  Neck Pain  The problem has been gradually improving (Still not back to baseline). The pain is associated with lifting a heavy object. The symptoms are aggravated by twisting and coughing. The pain is same all the time. Pertinent negatives include no chest pain, fever, headaches, leg pain, numbness, pain with swallowing, photophobia, tingling, trouble swallowing, visual change or weakness. She has tried muscle relaxants for the symptoms. The treatment provided moderate relief.      Past Medical History:  Diagnosis Date  . Dupuytren's contracture of left hand   . Gluten intolerance    Past Surgical History:  Procedure Laterality Date  . CESAREAN SECTION     Social History   Socioeconomic History  . Marital status: Married    Spouse name: Rocky Link  . Number of children: 1  . Years of education: 60  . Highest education level: Bachelor's degree (e.g., BA, AB, BS)  Occupational History  . Occupation: Heritage manager: OTHER    Comment: [employer]  Tobacco Use  . Smoking status: Never Smoker  . Smokeless tobacco: Never Used  Substance and Sexual Activity  . Alcohol use: Yes    Alcohol/week: 0.0 standard drinks    Comment: rare  .  Drug use: No  . Sexual activity: Yes    Partners: Male    Birth control/protection: OCP  Other Topics Concern  . Not on file  Social History Narrative  . Not on file   Social Determinants of Health   Financial Resource Strain:   . Difficulty of Paying Living Expenses:   Food Insecurity:   . Worried About Programme researcher, broadcasting/film/video in the Last Year:   . Barista in the Last Year:   Transportation Needs:   . Freight forwarder (Medical):   Marland Kitchen Lack of Transportation (Non-Medical):   Physical Activity:   . Days of Exercise per Week:   . Minutes of Exercise per Session:   Stress:   . Feeling of Stress :   Social Connections:   . Frequency of Communication with Friends and Family:   . Frequency of Social Gatherings with Friends and Family:   . Attends Religious Services:   . Active Member of Clubs or Organizations:   . Attends Banker Meetings:   Marland Kitchen Marital Status:   Intimate Partner Violence:   . Fear of Current or Ex-Partner:   . Emotionally Abused:   Marland Kitchen Physically Abused:   . Sexually Abused:    Family Status  Relation Name Status  . Mother  Deceased  . Father  Alive  . Sister  Alive  . Mat Uncle  Deceased  . Brother  Alive  . PGM  (Not  Specified)  . PGF  (Not Specified)  . MGM  (Not Specified)  . MGF  (Not Specified)  . Neg Hx  (Not Specified)   Family History  Problem Relation Age of Onset  . Arthritis Mother   . Cancer Mother        Lung  . Hypertension Mother   . Kidney disease Mother   . COPD Mother        smoker  . Alzheimer's disease Mother   . Arthritis Father   . Heart disease Father   . COPD Father        smoker  . Lung cancer Father   . Skin cancer Father   . Celiac disease Sister        has been told she is gluten intolerant, not officially diagnosed  . Kidney disease Sister   . Arthritis Sister   . Kidney disease Maternal Uncle 26  . Healthy Brother   . Heart disease Paternal Grandmother   . Hypertension Paternal  Grandmother   . Heart disease Paternal Grandfather   . Hypertension Paternal Grandfather   . Stroke Maternal Grandmother   . Stroke Maternal Grandfather   . Colon cancer Neg Hx   . Breast cancer Neg Hx   . Ovarian cancer Neg Hx   . Cervical cancer Neg Hx   . Drug abuse Neg Hx    Allergies  Allergen Reactions  . Codeine Itching    Can take cough syrup   . Gluten Meal   . Morphine And Related Nausea And Vomiting    Patient Care Team: Virginia Crews, MD as PCP - General (Family Medicine)   Medications: Outpatient Medications Prior to Visit  Medication Sig  . diphenhydrAMINE (BENADRYL ALLERGY) 25 MG tablet Take 25 mg by mouth at bedtime as needed.  Marland Kitchen estradiol-norethindrone (ACTIVELLA) 1-0.5 MG tablet Take by mouth.  . Melatonin 10 MG TABS Take 10 mg by mouth at bedtime.  . naproxen sodium (ALEVE) 220 MG tablet Take 220 mg by mouth at bedtime.  . NONFORMULARY OR COMPOUNDED ITEM Estriol 1 mg/gram vaginal cream 1/4 applicator vaginally nightly for two weeks, every other night for two weeks, then twice a week thereafter  . omeprazole (PRILOSEC) 20 MG capsule Take 20 mg by mouth daily.  . propranolol ER (INDERAL LA) 60 MG 24 hr capsule Take 60 mg by mouth daily.  . [DISCONTINUED] cyclobenzaprine (FLEXERIL) 5 MG tablet Take 1 tablet (5 mg total) by mouth 3 (three) times daily as needed for muscle spasms.  . [DISCONTINUED] naproxen (NAPROSYN) 500 MG tablet Take 1 tablet (500 mg total) by mouth 2 (two) times daily with a meal.  . solifenacin (VESICARE) 5 MG tablet Take by mouth.  . [DISCONTINUED] meloxicam (MOBIC) 15 MG tablet Take 1 tablet (15 mg total) by mouth daily.   No facility-administered medications prior to visit.    Review of Systems  Constitutional: Negative.  Negative for fever.  HENT: Negative.  Negative for trouble swallowing.   Eyes: Negative.  Negative for photophobia.  Respiratory: Negative.   Cardiovascular: Negative.  Negative for chest pain.    Gastrointestinal: Negative.   Endocrine: Negative.   Genitourinary: Negative.   Musculoskeletal: Positive for myalgias and neck stiffness. Negative for arthralgias, back pain, gait problem, joint swelling and neck pain.  Skin: Negative.   Allergic/Immunologic: Positive for food allergies.  Neurological: Negative.  Negative for tingling, weakness, numbness and headaches.  Hematological: Negative.   Psychiatric/Behavioral: Negative.     Last CBC  Lab Results  Component Value Date   WBC 10.2 04/26/2019   HGB 12.8 04/26/2019   HCT 38.6 04/26/2019   MCV 89 04/26/2019   MCH 29.6 04/26/2019   RDW 12.7 04/26/2019   PLT 312 04/26/2019   Last metabolic panel Lab Results  Component Value Date   GLUCOSE 87 10/14/2019   NA 138 10/14/2019   K 4.5 10/14/2019   CL 100 10/14/2019   CO2 24 10/14/2019   BUN 16 10/14/2019   CREATININE 0.73 10/14/2019   GFRNONAA 93 10/14/2019   GFRAA 107 10/14/2019   CALCIUM 9.7 10/14/2019   PROT 6.9 04/26/2019   ALBUMIN 4.4 04/26/2019   LABGLOB 2.5 04/26/2019   AGRATIO 1.8 04/26/2019   BILITOT 0.2 04/26/2019   ALKPHOS 76 04/26/2019   AST 26 04/26/2019   ALT 28 04/26/2019   Last lipids Lab Results  Component Value Date   CHOL 192 04/26/2019   HDL 46 04/26/2019   LDLCALC 115 (H) 04/26/2019   LDLDIRECT 151.2 09/30/2013   TRIG 155 (H) 04/26/2019   CHOLHDL 4.2 04/26/2019   Last hemoglobin A1c No results found for: HGBA1C Last thyroid functions Lab Results  Component Value Date   TSH 2.440 10/14/2019   T3TOTAL 139 11/13/2015   T4TOTAL 9.0 11/13/2015   Last vitamin D Lab Results  Component Value Date   VD25OH 32.32 11/09/2014   Last vitamin B12 and Folate Lab Results  Component Value Date   VITAMINB12 908 10/14/2019    Objective    BP 117/79 (BP Location: Left Arm, Patient Position: Sitting, Cuff Size: Normal)   Pulse 76   Temp (!) 96.9 F (36.1 C) (Temporal)   Ht 5\' 5"  (1.651 m)   Wt 144 lb (65.3 kg)   BMI 23.96 kg/m  BP  Readings from Last 3 Encounters:  04/19/20 117/79  02/28/20 115/78  10/14/19 112/61    Physical Exam Vitals and nursing note reviewed.  Constitutional:      Appearance: Normal appearance. She is well-developed.  HENT:     Head: Normocephalic and atraumatic.     Right Ear: Tympanic membrane, ear canal and external ear normal.     Left Ear: Tympanic membrane, ear canal and external ear normal.     Mouth/Throat:     Pharynx: Uvula midline.  Eyes:     General: Lids are normal. No scleral icterus.    Conjunctiva/sclera: Conjunctivae normal.     Pupils: Pupils are equal, round, and reactive to light.  Neck:     Thyroid: No thyroid mass or thyromegaly.     Vascular: No carotid bruit.     Trachea: Trachea normal.  Cardiovascular:     Rate and Rhythm: Normal rate and regular rhythm.     Pulses: Normal pulses.     Heart sounds: Normal heart sounds. No murmur.  Pulmonary:     Effort: Pulmonary effort is normal. No respiratory distress.     Breath sounds: Normal breath sounds. No wheezing or rhonchi.  Abdominal:     General: Bowel sounds are normal. There is no distension.     Palpations: Abdomen is soft.     Tenderness: There is no abdominal tenderness.  Musculoskeletal:        General: Normal range of motion.     Cervical back: Normal range of motion and neck supple.     Right lower leg: No edema.     Left lower leg: No edema.     Comments: Tightness of trapezius muscles R>L  Lymphadenopathy:     Cervical: No cervical adenopathy.  Skin:    General: Skin is warm and dry.  Neurological:     Mental Status: She is alert and oriented to person, place, and time. Mental status is at baseline.     Cranial Nerves: No cranial nerve deficit.     Sensory: No sensory deficit.     Motor: No weakness.     Gait: Gait normal.  Psychiatric:        Mood and Affect: Mood normal.        Speech: Speech normal.        Behavior: Behavior normal.        Thought Content: Thought content normal.      Depression Screen  PHQ 2/9 Scores 04/19/2020 04/26/2019 04/26/2019  PHQ - 2 Score 0 0 0  PHQ- 9 Score 2 2 -    Results for orders placed or performed in visit on 04/19/20  HM PAP SMEAR  Result Value Ref Range   HM Pap smear Normal HPV -     Assessment & Plan    Routine Health Maintenance and Physical Exam  Exercise Activities and Dietary recommendations Goals   None     Immunization History  Administered Date(s) Administered  . Influenza Inj Mdck Quad Pf 12/10/2018  . Influenza Split 09/25/2012  . Influenza,inj,Quad PF,6+ Mos 09/30/2013, 09/30/2014, 11/13/2015, 11/29/2016, 10/14/2019  . PFIZER SARS-COV-2 Vaccination 02/28/2020, 03/21/2020  . Tdap 09/25/2012    Health Maintenance  Topic Date Due  . INFLUENZA VACCINE  06/25/2020  . MAMMOGRAM  10/06/2021  . TETANUS/TDAP  09/25/2022  . PAP SMEAR-Modifier  10/02/2023  . COLONOSCOPY  01/13/2025  . COVID-19 Vaccine  Completed  . Hepatitis C Screening  Completed  . HIV Screening  Completed    Discussed health benefits of physical activity, and encouraged her to engage in regular exercise appropriate for her age and condition.  Problem List Items Addressed This Visit      Musculoskeletal and Integument   Trapezius muscle strain, right, initial encounter    Pt reports some improvement but still not to baseline. Meloxicam caused dizzness.  She tolerates cyclobenzaprine and nexproxen with some relief.  Refills provided.       Relevant Medications   naproxen (NAPROSYN) 500 MG tablet   cyclobenzaprine (FLEXERIL) 5 MG tablet     Other   Annual physical exam - Primary   Relevant Orders   Comprehensive metabolic panel   Lipid panel    Other Visit Diagnoses    Breast cancer screening by mammogram       Relevant Orders   MM 3D SCREEN BREAST BILATERAL       Return in about 1 year (around 04/19/2021) for CPE.     I, Shirlee Latch, MD, have reviewed all documentation for this visit. The documentation on  04/19/20 for the exam, diagnosis, procedures, and orders are all accurate and complete.   Opel Lejeune, Marzella Schlein, MD, MPH [facility]

## 2020-04-19 NOTE — Assessment & Plan Note (Signed)
Pt reports some improvement but still not to baseline. Meloxicam caused dizzness.  She tolerates cyclobenzaprine and nexproxen with some relief.  Refills provided.

## 2020-04-19 NOTE — Patient Instructions (Signed)

## 2020-04-26 ENCOUNTER — Other Ambulatory Visit: Payer: Self-pay | Admitting: Family Medicine

## 2020-04-26 MED ORDER — PROPRANOLOL HCL ER 60 MG PO CP24: 60.0000 mg | ORAL_CAPSULE | Freq: Every day | ORAL | 1 refills | Status: AC

## 2020-04-26 NOTE — Telephone Encounter (Signed)
[facility] faxed refill request for the following medications:  propranolol ER (INDERAL LA) 60 MG 24 hr capsule    Please advise.  Thanks, [facility]  

## 2020-05-13 LAB — COMPREHENSIVE METABOLIC PANEL
ALT: 17 IU/L (ref 0–32)
AST: 21 IU/L (ref 0–40)
Albumin/Globulin Ratio: 1.6 (ref 1.2–2.2)
Albumin: 4.3 g/dL (ref 3.8–4.9)
Alkaline Phosphatase: 86 IU/L (ref 48–121)
BUN/Creatinine Ratio: 14 (ref 9–23)
BUN: 13 mg/dL (ref 6–24)
Bilirubin Total: <0.2 mg/dL (ref 0.0–1.2)
CO2: 23 mmol/L (ref 20–29)
Calcium: 9.3 mg/dL (ref 8.7–10.2)
Chloride: 103 mmol/L (ref 96–106)
Creatinine, Ser: 0.95 mg/dL (ref 0.57–1.00)
GFR calc Af Amer: 78 mL/min/{1.73_m2} (ref >59)
GFR calc non Af Amer: 68 mL/min/{1.73_m2} (ref >59)
Globulin, Total: 2.7 g/dL (ref 1.5–4.5)
Glucose: 93 mg/dL (ref 65–99)
Potassium: 4.9 mmol/L (ref 3.5–5.2)
Sodium: 141 mmol/L (ref 134–144)
Total Protein: 7 g/dL (ref 6.0–8.5)

## 2020-05-13 LAB — LIPID PANEL
Chol/HDL Ratio: 3.9 ratio (ref 0.0–4.4)
Cholesterol, Total: 173 mg/dL (ref 100–199)
HDL: 44 mg/dL (ref >39)
LDL Chol Calc (NIH): 105 mg/dL — ABNORMAL HIGH (ref 0–99)
Triglycerides: 134 mg/dL (ref 0–149)
VLDL Cholesterol Cal: 24 mg/dL (ref 5–40)

## 2020-05-15 ENCOUNTER — Telehealth: Payer: Self-pay

## 2020-05-15 NOTE — Telephone Encounter (Signed)
-----   Message from Erasmo Downer, MD sent at 05/15/2020 12:09 PM EDT ----- Normal labs

## 2020-05-15 NOTE — Telephone Encounter (Signed)
Written by Erasmo Downer, MD on 05/15/2020 12:09 PM EDT Seen by patient Leslie Ruiz on 05/15/2020 12:15 PM

## 2020-06-23 ENCOUNTER — Other Ambulatory Visit: Payer: Self-pay | Admitting: Family Medicine

## 2020-06-23 ENCOUNTER — Encounter: Payer: Self-pay | Admitting: Family Medicine

## 2020-06-23 DIAGNOSIS — S46811A Strain of other muscles, fascia and tendons at shoulder and upper arm level, right arm, initial encounter: Secondary | ICD-10-CM

## 2020-06-23 NOTE — Telephone Encounter (Signed)
Requested medication (s) are due for refill today: Yes  Requested medication (s) are on the active medication list: Yes  Last refill:  04/19/20  Future visit scheduled: Yes  Notes to clinic:  See request.    Requested Prescriptions  Pending Prescriptions Disp Refills   cyclobenzaprine (FLEXERIL) 5 MG tablet [Pharmacy Med Name: Cyclobenzaprine HCl 5 MG Oral Tablet] 30 tablet 0    Sig: Take 1 tablet by mouth three times daily as needed for muscle spasm      Not Delegated - Analgesics:  Muscle Relaxants Failed - 06/23/2020 10:39 AM      Failed - This refill cannot be delegated      Passed - Valid encounter within last 6 months    Recent Outpatient Visits           2 months ago Annual physical exam   [facility] [place], Marzella Schlein, MD   3 months ago Trapezius muscle strain, right, initial encounter   [facility] [place], Marzella Schlein, MD   8 months ago Anti-TPO antibodies present   [facility], Marzella Schlein, MD   1 year ago Encounter for annual physical exam   [facility] [place], Marzella Schlein, MD   1 year ago Swelling of right index finger   [facility] [place], Marzella Schlein, MD       Future Appointments             In 10 months [place], Marzella Schlein, MD [facility]

## 2020-06-26 MED ORDER — NAPROXEN 500 MG PO TABS: 500.0000 mg | ORAL_TABLET | Freq: Two times a day (BID) | ORAL | 1 refills | Status: DC

## 2020-06-26 MED ORDER — CYCLOBENZAPRINE HCL 5 MG PO TABS: 5.0000 mg | ORAL_TABLET | Freq: Three times a day (TID) | ORAL | 0 refills | Status: DC | PRN

## 2020-07-21 ENCOUNTER — Other Ambulatory Visit: Payer: Self-pay | Admitting: Physician Assistant

## 2020-07-21 ENCOUNTER — Other Ambulatory Visit: Payer: Self-pay | Admitting: Family Medicine

## 2020-07-21 DIAGNOSIS — S46811A Strain of other muscles, fascia and tendons at shoulder and upper arm level, right arm, initial encounter: Secondary | ICD-10-CM

## 2020-07-21 NOTE — Telephone Encounter (Signed)
Requested medication (s) are due for refill today: yes  Requested medication (s) are on the active medication list: yes  Last refill:  06/23/2020  Future visit scheduled: yes  Notes to clinic:  this refill cannot be delegated    Requested Prescriptions  Pending Prescriptions Disp Refills   cyclobenzaprine (FLEXERIL) 5 MG tablet [Pharmacy Med Name: Cyclobenzaprine HCl 5 MG Oral Tablet] 30 tablet 0    Sig: Take 1 tablet by mouth three times daily as needed for muscle spasm      Not Delegated - Analgesics:  Muscle Relaxants Failed - 07/21/2020  2:39 PM      Failed - This refill cannot be delegated      Passed - Valid encounter within last 6 months    Recent Outpatient Visits           3 months ago Annual physical exam   [facility] [place], Marzella Schlein, MD   4 months ago Trapezius muscle strain, right, initial encounter   [facility] [place], Marzella Schlein, MD   9 months ago Anti-TPO antibodies present   [facility] [place], Marzella Schlein, MD   1 year ago Encounter for annual physical exam   [facility] [place], Marzella Schlein, MD   1 year ago Swelling of right index finger   [facility] [place], Marzella Schlein, MD       Future Appointments             In 9 months Bacigalupo, Marzella Schlein, MD [facility], [facility]

## 2020-07-24 ENCOUNTER — Other Ambulatory Visit: Payer: Self-pay | Admitting: Family Medicine

## 2020-07-24 DIAGNOSIS — S46811A Strain of other muscles, fascia and tendons at shoulder and upper arm level, right arm, initial encounter: Secondary | ICD-10-CM

## 2020-07-24 MED ORDER — NAPROXEN 500 MG PO TABS: 500.0000 mg | ORAL_TABLET | Freq: Two times a day (BID) | ORAL | 1 refills | Status: DC

## 2020-07-24 NOTE — Telephone Encounter (Signed)
[facility] faxed refill request for the following medications:  naproxen (NAPROSYN) 500 MG tablet   Please advise. Thanks, [employer]

## 2020-08-05 ENCOUNTER — Other Ambulatory Visit: Payer: Self-pay

## 2020-08-05 ENCOUNTER — Ambulatory Visit
Admission: EM | Admit: 2020-08-05 | Discharge: 2020-08-05 | Disposition: A | Discharge status: Home / Self Care | Payer: PRIVATE HEALTH INSURANCE | Attending: Physician Assistant | Admitting: Physician Assistant

## 2020-08-05 ENCOUNTER — Ambulatory Visit (INDEPENDENT_AMBULATORY_CARE_PROVIDER_SITE_OTHER): Payer: PRIVATE HEALTH INSURANCE

## 2020-08-05 DIAGNOSIS — S46811A Strain of other muscles, fascia and tendons at shoulder and upper arm level, right arm, initial encounter: Secondary | ICD-10-CM

## 2020-08-05 DIAGNOSIS — R0781 Pleurodynia: Secondary | ICD-10-CM | POA: Diagnosis not present

## 2020-08-05 DIAGNOSIS — M549 Dorsalgia, unspecified: Secondary | ICD-10-CM

## 2020-08-05 DIAGNOSIS — S299XXA Unspecified injury of thorax, initial encounter: Secondary | ICD-10-CM | POA: Diagnosis not present

## 2020-08-05 NOTE — ED Provider Notes (Addendum)
[facility]    CSN: [id number] Arrival date & time: 08/05/20  1331      History   Chief Complaint Chief Complaint  Patient presents with  . Rib Injury    HPI Leslie Ruiz is a 54 y.o. female.   Patient reports evaluation of rib and back pain after falling off horse today.  She reports horse bucked her folder she fell onto her right side.  She reports right and left sided rib pain however has more pain on the left side.  She also ports back pain.  Denies any numbness, tingling or weakness.  Denies difficulty breathing.  She was wearing a helmet at the time and did not hit her head.  No neck pain.  Was able to get up and walk away.     Past Medical History:  Diagnosis Date  . Dupuytren's contracture of left hand   . Gluten intolerance     Patient Active Problem List   Diagnosis Date Noted  . Trapezius muscle strain, right, initial encounter 02/28/2020  . Family history of kidney disease 04/26/2019  . Premature atrial contractions 01/30/2018  . GERD (gastroesophageal reflux disease) 01/20/2018  . Adjustment disorder with mixed anxiety and depressed mood 11/13/2015  . 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 Dupuytren's contracture 10/25/2013  . Annual physical exam 09/30/2013    Past Surgical History:  Procedure Laterality Date  . CESAREAN SECTION      OB History   No obstetric history on file.      Home Medications    Prior to Admission medications   Medication Sig Start Date End Date Taking? Authorizing Provider  cyclobenzaprine (FLEXERIL) 5 MG tablet Take 1 tablet by mouth three times daily as needed for muscle spasm 07/21/20  Yes Bacigalupo, Marzella Schlein, MD  diphenhydrAMINE (BENADRYL ALLERGY) 25 MG tablet Take 25 mg by mouth at bedtime as needed.   Yes [provider]  estradiol-norethindrone (ACTIVELLA) 1-0.5 MG tablet Take by mouth. 06/02/19 08/05/20 Yes [provider]  Melatonin 10 MG TABS Take 10 mg by mouth at bedtime.    Yes [provider]  naproxen (NAPROSYN) 500 MG tablet TAKE 1 TABLET BY MOUTH TWICE DAILY WITH A MEAL 07/24/20  Yes Margaretann Loveless, PA-C  naproxen sodium (ALEVE) 220 MG tablet Take 220 mg by mouth at bedtime.   Yes [provider]  NONFORMULARY OR COMPOUNDED ITEM Estriol 1 mg/gram vaginal cream 1/4 applicator vaginally nightly for two weeks, every other night for two weeks, then twice a week thereafter 12/31/17  Yes [provider]  omeprazole (PRILOSEC) 20 MG capsule Take 20 mg by mouth daily.   Yes [provider]  propranolol ER (INDERAL LA) 60 MG 24 hr capsule Take 1 capsule (60 mg total) by mouth daily. 04/26/20  Yes Bacigalupo, Marzella Schlein, MD  naproxen (NAPROSYN) 500 MG tablet Take 1 tablet (500 mg total) by mouth 2 (two) times daily with a meal. 07/24/20   Bacigalupo, Marzella Schlein, MD  solifenacin (VESICARE) 5 MG tablet Take by mouth. 10/05/19   [provider]    Family History Family History  Problem Relation Age of Onset  . Arthritis Mother   . Cancer Mother        Lung  . Hypertension Mother   . Kidney disease Mother   . COPD Mother        smoker  . Alzheimer's disease Mother   . Arthritis Father   . Heart disease Father   .  COPD Father        smoker  . Lung cancer Father   . Skin cancer Father   . Celiac disease Sister        has been told she is gluten intolerant, not officially diagnosed  . Kidney disease Sister   . Arthritis Sister   . Kidney disease Maternal Uncle 30  . Healthy Brother   . Heart disease Paternal Grandmother   . Hypertension Paternal Grandmother   . Heart disease Paternal Grandfather   . Hypertension Paternal Grandfather   . Stroke Maternal Grandmother   . Stroke Maternal Grandfather   . Colon cancer Neg Hx   . Breast cancer Neg Hx   . Ovarian cancer Neg Hx   . Cervical cancer Neg Hx   . Drug abuse Neg Hx     Social History Social History   Tobacco Use  . Smoking status: Never Smoker  .  Smokeless tobacco: Never Used  Vaping Use  . Vaping Use: Never used  Substance Use Topics  . Alcohol use: Yes    Alcohol/week: 0.0 standard drinks    Comment: rare  . Drug use: No     Allergies   Codeine, Gluten meal, and Morphine and related   Review of Systems Review of Systems   Physical Exam Triage Vital Signs ED Triage Vitals  Enc Vitals Group     BP 08/05/20 1404 126/81     Pulse Rate 08/05/20 1404 83     Resp 08/05/20 1404 18     Temp 08/05/20 1404 98.6 F (37 C)     Temp Source 08/05/20 1404 Oral     SpO2 08/05/20 1404 100 %     Weight 08/05/20 1401 144 lb (65.3 kg)     Height 08/05/20 1401 5\' 5"  (1.651 m)     Head Circumference --      Peak Flow --      Pain Score 08/05/20 1401 8     Pain Loc --      Pain Edu? --      Excl. in GC? --    No data found.  Updated Vital Signs BP 126/81 (BP Location: Left Arm)   Pulse 83   Temp 98.6 F (37 C) (Oral)   Resp 18   Ht 5\' 5"  (1.651 m)   Wt 144 lb (65.3 kg)   SpO2 100%   BMI 23.96 kg/m   Visual Acuity Right Eye Distance:   Left Eye Distance:   Bilateral Distance:    Right Eye Near:   Left Eye Near:    Bilateral Near:     Physical Exam Vitals and nursing note reviewed.  Constitutional:      General: She is not in acute distress.    Appearance: She is well-developed. She is not ill-appearing.  HENT:     Head: Normocephalic and atraumatic.  Eyes:     Conjunctiva/sclera: Conjunctivae normal.  Cardiovascular:     Rate and Rhythm: Normal rate and regular rhythm.     Heart sounds: No murmur heard.   Pulmonary:     Effort: Pulmonary effort is normal. No respiratory distress.     Breath sounds: Normal breath sounds.  Abdominal:     Palpations: Abdomen is soft.     Tenderness: There is no abdominal tenderness.  Musculoskeletal:     Cervical back: Normal range of motion and neck supple. No rigidity or tenderness.     Comments: Tenderness to palpation bilateral thoracic ribs.  No midline spinal  tenderness through the cervical thoracic or lumbar.  No pain with cavity compression.  Full range of motion of spinal column.  5/5 in all extremities.  Sensation intact.  Skin:    General: Skin is warm and dry.  Neurological:     General: No focal deficit present.     Mental Status: She is alert and oriented to person, place, and time.     Cranial Nerves: No cranial nerve deficit.     Sensory: No sensory deficit.     Motor: No weakness.     Coordination: Coordination normal.      UC Treatments / Results  Labs (all labs ordered are listed, but only abnormal results are displayed) Labs Reviewed - No data to display  EKG   Radiology DG Ribs Bilateral W/Chest  Result Date: 08/05/2020 CLINICAL DATA:  Patient thrown from horse EXAM: BILATERAL RIBS AND CHEST - 4+ VIEW COMPARISON:  Chest radiograph Mar 30, 2011 FINDINGS: Frontal chest as well as oblique and cone-down rib images obtained. There is no edema or airspace opacity. Heart size and pulmonary vascularity are normal. No adenopathy. There is no appreciable pneumothorax or pleural effusion. No rib fracture evident. IMPRESSION: No fracture.  No pneumothorax.  Lungs clear. Electronically Signed   By: Bretta Bang III M.D.   On: 08/05/2020 15:06   DG Thoracic Spine 2 View  Result Date: 08/05/2020 CLINICAL DATA:  Patient thrown from horse EXAM: THORACIC SPINE 3 VIEWS COMPARISON:  None. FINDINGS: Frontal, lateral, and swimmer's views were obtained. No fracture or spondylolisthesis. The disc spaces appear unremarkable. No erosive change or paraspinous lesions. IMPRESSION: No fracture or spondylolisthesis.  No evident arthropathy. Electronically Signed   By: Bretta Bang III M.D.   On: 08/05/2020 15:07    Procedures Procedures (including critical care time)  Medications Ordered in UC Medications - No data to display  Initial Impression / Assessment and Plan / UC Course  I have reviewed the triage vital signs and the nursing  notes.  Pertinent labs & imaging results that were available during my care of the patient were reviewed by me and considered in my medical decision making (see chart for details).     #Rib pain #Fall from horse #musculoskeletal back pain Patient 54 year old presenting with rib pain musculoskeletal back pain after fall from horse.  X-rays negative for bony fracture.  No red flags here today.  We will treat her symptomatically with follow-up precautions.  Patient currently has NSAIDs and muscle relaxer at home.  Recommend continue use of these.  Discussed return emergency for precautions.  She verbalized understand plan of care Final Clinical Impressions(s) / UC Diagnoses   Final diagnoses:  Rib pain  Fall from horse, initial encounter  Musculoskeletal back pain     Discharge Instructions     X-rays were negative  Continue the naproxen and Flexeril you have at home  Apply heat and ice  Rest If severe symptoms were to develop such as shortness of breath, severely worsening pain, severe headache, vomiting or dizziness return or go to the emergency department      ED Prescriptions    None     PDMP not reviewed this encounter.   Hermelinda Medicus, PA-C 08/05/20 1730    Morgen Ritacco, Veryl Speak, PA-C 08/05/20 1730

## 2020-08-05 NOTE — ED Triage Notes (Signed)
Patient states that she was on a trail ride around 1 hour ago, her horse bucked due to a horse fly and threw her off. States that she has bilateral rib pain and back pain. States that she landed on her right side but is having the most pain on the left side.

## 2020-08-05 NOTE — Discharge Instructions (Signed)
X-rays were negative  Continue the naproxen and Flexeril you have at home  Apply heat and ice  Rest If severe symptoms were to develop such as shortness of breath, severely worsening pain, severe headache, vomiting or dizziness return or go to the emergency department

## 2020-08-21 ENCOUNTER — Encounter: Payer: Self-pay | Admitting: Family Medicine

## 2020-08-23 ENCOUNTER — Other Ambulatory Visit: Payer: Self-pay | Admitting: Physician Assistant

## 2020-08-23 DIAGNOSIS — S46811A Strain of other muscles, fascia and tendons at shoulder and upper arm level, right arm, initial encounter: Secondary | ICD-10-CM

## 2020-09-05 MED ORDER — VENLAFAXINE HCL ER 37.5 MG PO CP24: 37.5000 mg | ORAL_CAPSULE | Freq: Every day | ORAL | 3 refills | Status: DC

## 2020-09-05 NOTE — Telephone Encounter (Signed)
Please take Celexa off of medication list and send in Effexor XR 37.5 mg daily #30 r3. Thanks!

## 2020-09-22 ENCOUNTER — Other Ambulatory Visit: Payer: Self-pay | Admitting: Family Medicine

## 2020-09-22 DIAGNOSIS — S46811A Strain of other muscles, fascia and tendons at shoulder and upper arm level, right arm, initial encounter: Secondary | ICD-10-CM

## 2020-09-29 IMAGING — MG MM DIGITAL DIAGNOSTIC UNILAT*L* W/ TOMO W/ CAD
6 series · 6 of 18 positions shown · non-contrast
Comparison: Previous exam(s).

CLINICAL DATA: Screening recall for a possible asymmetry in the
left breast.

EXAM:
DIGITAL DIAGNOSTIC UNILATERAL LEFT MAMMOGRAM WITH CAD AND TOMO

[L ML synth-2D (1 of 2)]
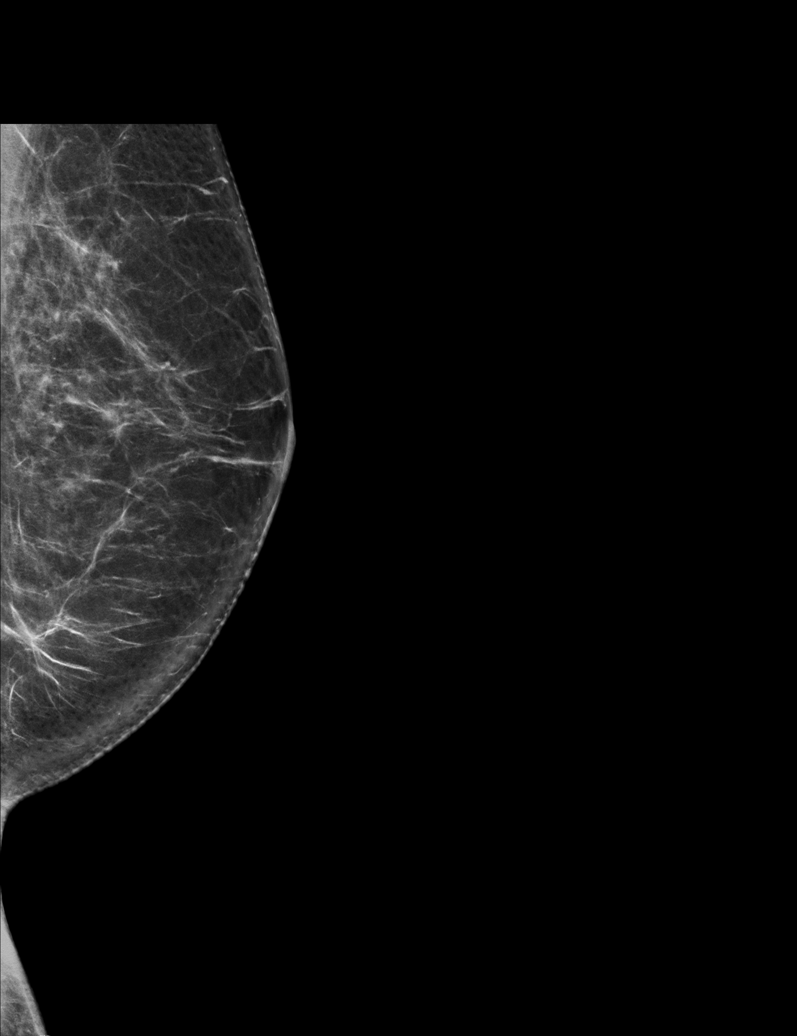

[L MLO synth-2D]
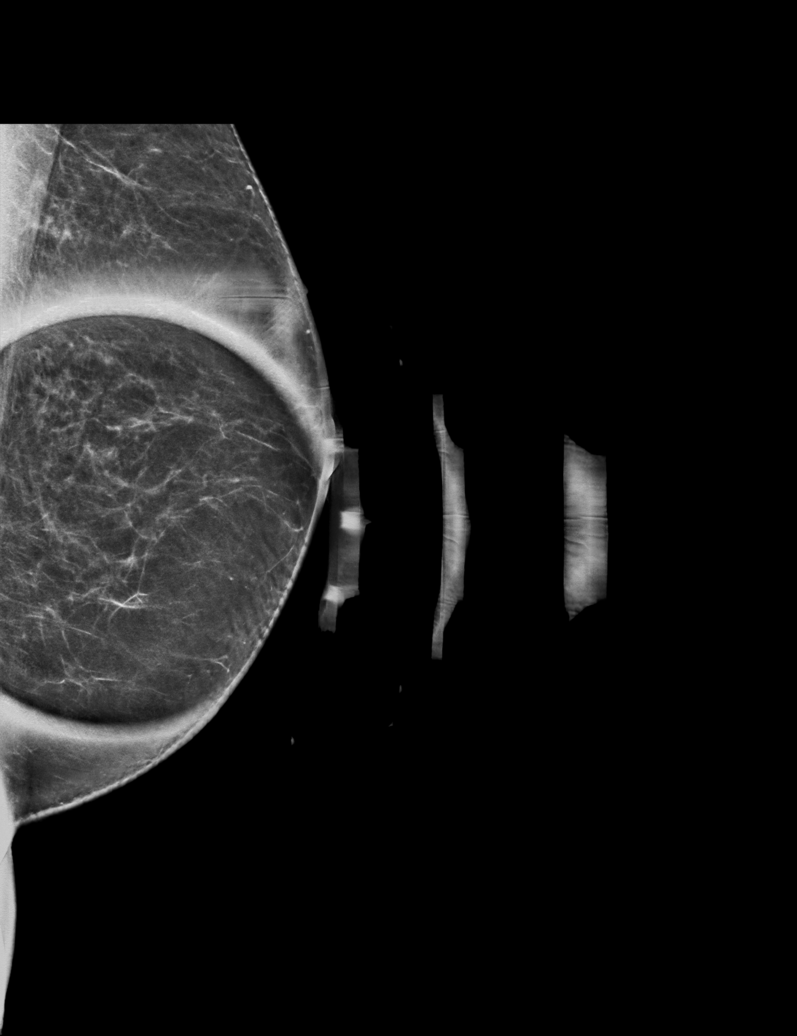

[L ML synth-2D (2 of 2)]
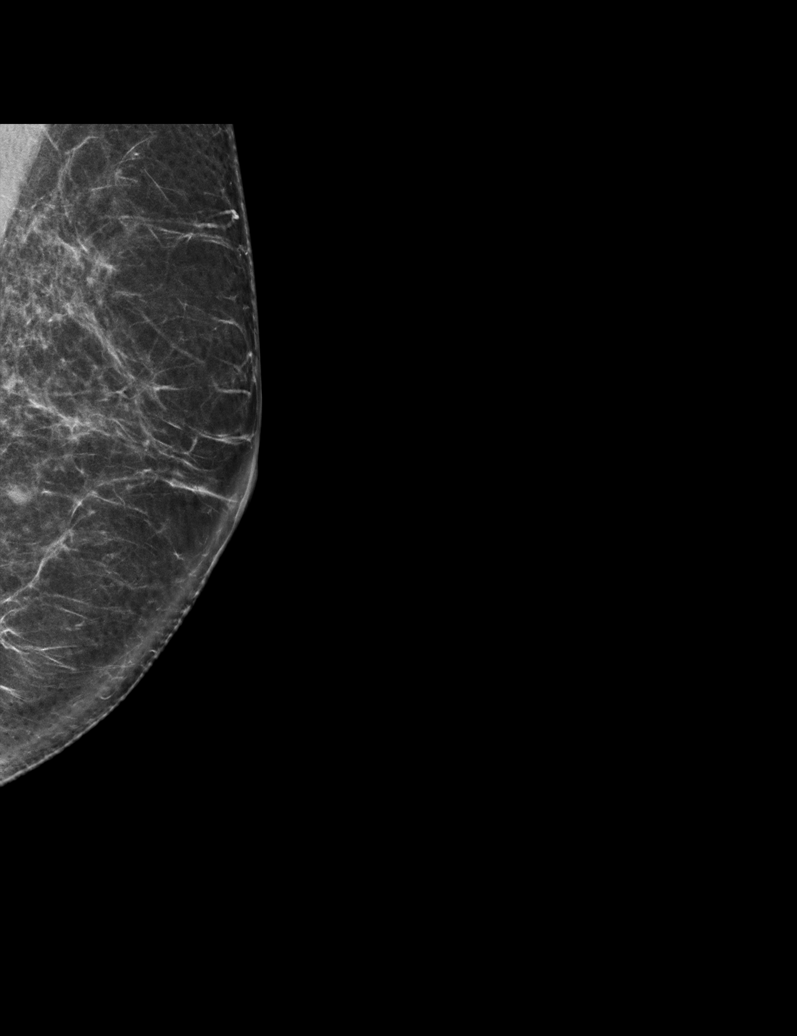

[L MLO tomo · tomo slice 32/63.0]
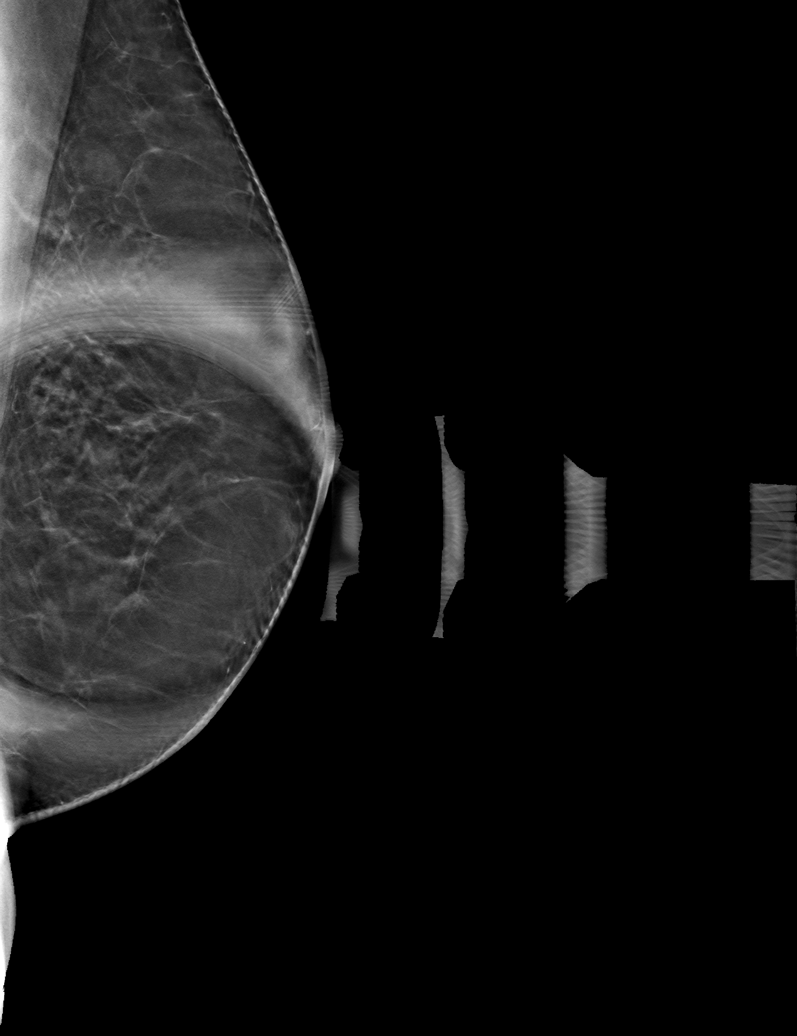

[L ML tomo (1 of 2) · tomo slice 39/77.0]
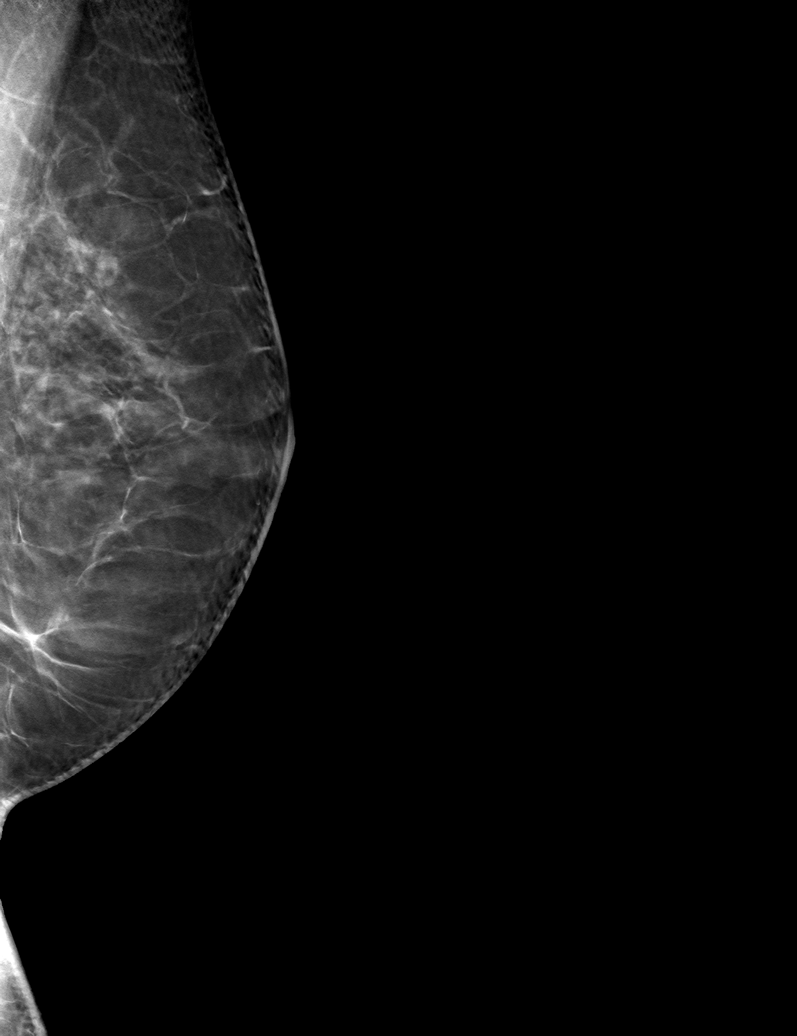

[L ML tomo (2 of 2) · tomo slice 37/74.0]
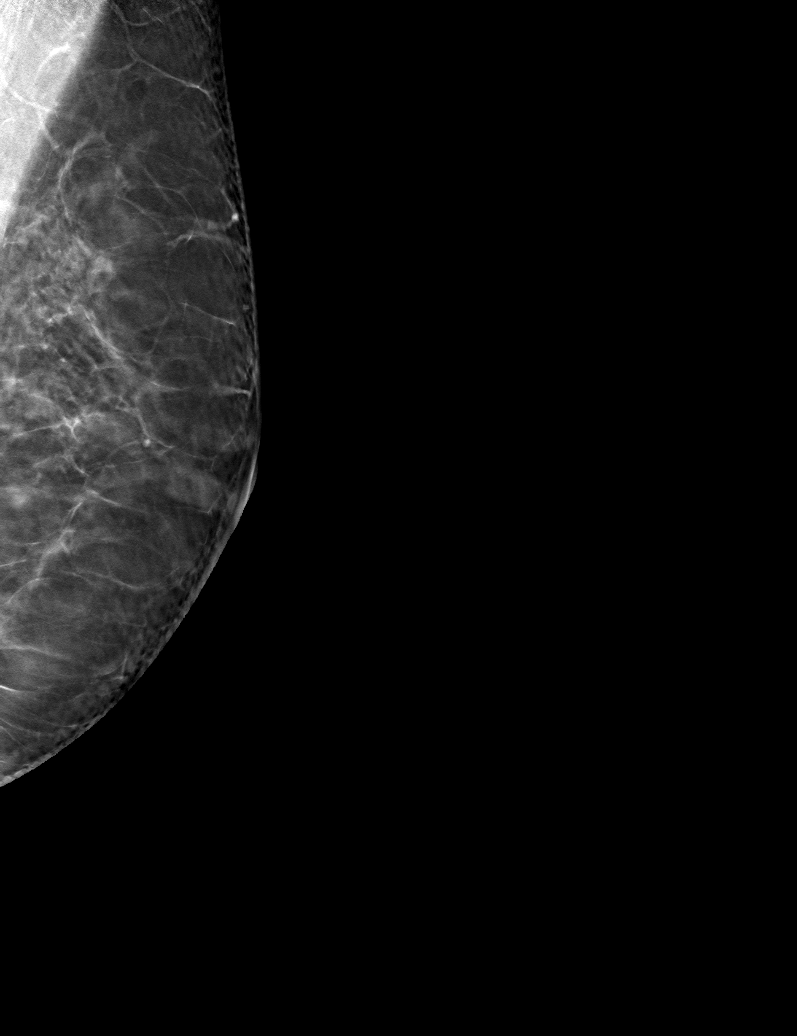

[6 of 18 positions shown; findings below may reference images not displayed]

ACR Breast Density Category b: There are scattered areas of
fibroglandular density.
FINDINGS: Possible asymmetry noted on the screening MLO view disperses on spot
compression imaging consistent with superimposed fibroglandular
tissue. There is no underlying mass or significant residual
asymmetry. There is no architectural distortion and there are no
suspicious calcifications.

Mammographic images were processed with CAD.
IMPRESSION: Negative exam.  No evidence of breast malignancy.

RECOMMENDATION:
Screening mammogram in one year.(Code:24-7-WCI)

I have discussed the findings and recommendations with the patient.
If applicable, a reminder letter will be sent to the patient
regarding the next appointment.

BI-RADS CATEGORY  1: Negative.

## 2020-10-22 ENCOUNTER — Other Ambulatory Visit: Payer: Self-pay | Admitting: Family Medicine

## 2020-10-22 DIAGNOSIS — S46811A Strain of other muscles, fascia and tendons at shoulder and upper arm level, right arm, initial encounter: Secondary | ICD-10-CM

## 2020-10-22 NOTE — Telephone Encounter (Signed)
Requested Prescriptions  Pending Prescriptions Disp Refills   naproxen (NAPROSYN) 500 MG tablet [Pharmacy Med Name: Naproxen 500 MG Oral Tablet] 60 tablet 0    Sig: TAKE 1 TABLET BY MOUTH TWICE DAILY WITH A MEAL     Analgesics:  NSAIDS Failed - 10/22/2020  1:36 PM      Failed - HGB in normal range and within 360 days    Hemoglobin  Date Value Ref Range Status  04/26/2019 12.8 11.1 - 15.9 g/dL Final         Passed - Cr in normal range and within 360 days    Creatinine, Ser  Date Value Ref Range Status  05/12/2020 0.95 0.57 - 1.00 mg/dL Final   Creatinine,U  Date Value Ref Range Status  11/09/2014 138.1 mg/dL Final         Passed - Patient is not pregnant      Passed - Valid encounter within last 12 months    Recent Outpatient Visits          6 months ago Annual physical exam   [facility] [place], Marzella Schlein, MD   7 months ago Trapezius muscle strain, right, initial encounter   [facility] [place], Marzella Schlein, MD   1 year ago Anti-TPO antibodies present   [facility], Marzella Schlein, MD   1 year ago Encounter for annual physical exam   [facility] [place], Marzella Schlein, MD   1 year ago Swelling of right index finger   [facility] [place], Marzella Schlein, MD      Future Appointments            In 6 months Bacigalupo, Marzella Schlein, MD [facility], [facility]

## 2020-11-22 ENCOUNTER — Other Ambulatory Visit: Payer: Self-pay | Admitting: Family Medicine

## 2021-01-05 ENCOUNTER — Other Ambulatory Visit: Payer: Self-pay

## 2021-01-05 ENCOUNTER — Ambulatory Visit
Admission: RE | Admit: 2021-01-05 | Discharge: 2021-01-05 | Disposition: A | Discharge status: Home / Self Care | Payer: PRIVATE HEALTH INSURANCE | Source: Ambulatory Visit | Attending: Family Medicine | Admitting: Family Medicine

## 2021-01-05 DIAGNOSIS — Z1231 Encounter for screening mammogram for malignant neoplasm of breast: Secondary | ICD-10-CM | POA: Insufficient documentation

## 2021-01-15 ENCOUNTER — Encounter: Payer: Self-pay | Admitting: Family Medicine

## 2021-01-18 ENCOUNTER — Other Ambulatory Visit: Payer: Self-pay | Admitting: Family Medicine

## 2021-01-18 DIAGNOSIS — S46811A Strain of other muscles, fascia and tendons at shoulder and upper arm level, right arm, initial encounter: Secondary | ICD-10-CM

## 2021-01-18 NOTE — Telephone Encounter (Signed)
LOV 03/2020

## 2021-01-18 NOTE — Telephone Encounter (Signed)
Requested medication (s) are due for refill today:no  Requested medication (s) are on the active medication list: yes  Last refill:  11/22/2020  Future visit scheduled: yes  Notes to clinic: this refill cannot be delegated    Requested Prescriptions  Pending Prescriptions Disp Refills   cyclobenzaprine (FLEXERIL) 5 MG tablet [Pharmacy Med Name: Cyclobenzaprine HCl 5 MG Oral Tablet] 30 tablet 0    Sig: Take 1 tablet by mouth three times daily as needed for muscle spasm      Not Delegated - Analgesics:  Muscle Relaxants Failed - 01/18/2021  9:38 AM      Failed - This refill cannot be delegated      Failed - Valid encounter within last 6 months    Recent Outpatient Visits           9 months ago Annual physical exam   [facility] [place], Marzella Schlein, MD   10 months ago Trapezius muscle strain, right, initial encounter   [facility] [place], Marzella Schlein, MD   1 year ago Anti-TPO antibodies present   [facility], Marzella Schlein, MD   1 year ago Encounter for annual physical exam   [facility] [place], Marzella Schlein, MD   1 year ago Swelling of right index finger   [facility] [place], Marzella Schlein, MD       Future Appointments             In 3 months Bacigalupo, Marzella Schlein, MD [facility]              Signed Prescriptions Disp Refills   naproxen (NAPROSYN) 500 MG tablet 60 tablet 0    Sig: TAKE 1 TABLET BY MOUTH TWICE DAILY WITH A MEAL      Analgesics:  NSAIDS Failed - 01/18/2021  9:38 AM      Failed - HGB in normal range and within 360 days    Hemoglobin  Date Value Ref Range Status  04/26/2019 12.8 11.1 - 15.9 g/dL Final          Passed - Cr in normal range and within 360 days    Creatinine, Ser  Date Value Ref Range Status  05/12/2020 0.95 0.57 - 1.00 mg/dL Final   Creatinine,U  Date Value Ref Range Status  11/09/2014 138.1 mg/dL Final          Passed -  Patient is not pregnant      Passed - Valid encounter within last 12 months    Recent Outpatient Visits           9 months ago Annual physical exam   [facility] [place], Marzella Schlein, MD   10 months ago Trapezius muscle strain, right, initial encounter   [facility] [place], Marzella Schlein, MD   1 year ago Anti-TPO antibodies present   [facility], Marzella Schlein, MD   1 year ago Encounter for annual physical exam   [facility] [place], Marzella Schlein, MD   1 year ago Swelling of right index finger   [facility] [place], Marzella Schlein, MD       Future Appointments             In 3 months Bacigalupo, Marzella Schlein, MD [facility], [facility]

## 2021-02-05 ENCOUNTER — Other Ambulatory Visit: Payer: Self-pay | Admitting: Family Medicine

## 2021-02-05 DIAGNOSIS — S46811A Strain of other muscles, fascia and tendons at shoulder and upper arm level, right arm, initial encounter: Secondary | ICD-10-CM

## 2021-02-05 NOTE — Telephone Encounter (Signed)
Requested medication (s) are due for refill today:   Provider to review  Requested medication (s) are on the active medication list:   Yes  Future visit scheduled:   Yes   Last ordered: 01/18/2021 #30, 0 refills  Non delegated refill   Requested Prescriptions  Pending Prescriptions Disp Refills   cyclobenzaprine (FLEXERIL) 5 MG tablet [Pharmacy Med Name: Cyclobenzaprine HCl 5 MG Oral Tablet] 30 tablet 0    Sig: Take 1 tablet by mouth three times daily as needed for muscle spasm      Not Delegated - Analgesics:  Muscle Relaxants Failed - 02/05/2021 10:38 AM      Failed - This refill cannot be delegated      Failed - Valid encounter within last 6 months    Recent Outpatient Visits           9 months ago Annual physical exam   [facility] [place], Marzella Schlein, MD   11 months ago Trapezius muscle strain, right, initial encounter   [facility] [place], Marzella Schlein, MD   1 year ago Anti-TPO antibodies present   [facility], Marzella Schlein, MD   1 year ago Encounter for annual physical exam   [facility] [place], Marzella Schlein, MD   1 year ago Swelling of right index finger   [facility] [place], Marzella Schlein, MD       Future Appointments             In 2 months Bacigalupo, Marzella Schlein, MD [facility], [facility]

## 2021-04-10 ENCOUNTER — Telehealth: Payer: Self-pay

## 2021-04-10 NOTE — Telephone Encounter (Signed)
Copied from CRM [id number]. Topic: Appointment Scheduling - Scheduling Inquiry for Clinic >> Apr 10, 2021 10:49 AM Wyonia Hough E wrote: Reason for CRM: Pt was told her 6.6.22 cpe needs to be rescheduled/ Pt stated she could not wait until January to have her cpe and she has health concerns she wanted to address during this next appt and wanted to check her thyroid/ please advise if there is something sooner

## 2021-04-12 NOTE — Telephone Encounter (Signed)
I think there is a blocked appt on 5/23 afternoon that could be used maybe if it is not too early. Otherwise, can only use appts if they exist.  She could see any provider

## 2021-04-26 ENCOUNTER — Encounter: Payer: Self-pay | Admitting: Family Medicine

## 2021-04-26 ENCOUNTER — Other Ambulatory Visit: Payer: Self-pay

## 2021-04-26 ENCOUNTER — Ambulatory Visit (INDEPENDENT_AMBULATORY_CARE_PROVIDER_SITE_OTHER): Payer: PRIVATE HEALTH INSURANCE | Admitting: Family Medicine

## 2021-04-26 VITALS — BP 118/75 | HR 75 | Resp 16 | Ht 65.0 in | Wt 144.4 lb

## 2021-04-26 DIAGNOSIS — R002 Palpitations: Secondary | ICD-10-CM

## 2021-04-26 DIAGNOSIS — F4323 Adjustment disorder with mixed anxiety and depressed mood: Secondary | ICD-10-CM

## 2021-04-26 DIAGNOSIS — Z0000 Encounter for general adult medical examination without abnormal findings: Secondary | ICD-10-CM

## 2021-04-26 MED ORDER — HYDROXYZINE HCL 10 MG PO TABS: 10.0000 mg | ORAL_TABLET | Freq: Three times a day (TID) | ORAL | 0 refills | Status: DC | PRN

## 2021-04-26 MED ORDER — CYCLOBENZAPRINE HCL 5 MG PO TABS: 5.0000 mg | ORAL_TABLET | Freq: Three times a day (TID) | ORAL | 5 refills | Status: DC | PRN

## 2021-04-26 NOTE — Progress Notes (Signed)
Complete physical exam   Patient: Leslie Ruiz   DOB: 02/21/1966   55 y.o. Female  MRN: [id number] Visit Date: 04/26/2021  Today's healthcare provider: Shirlee Latch, MD   Chief Complaint  Patient presents with  . Annual Exam   Subjective     HPI  Leslie Ruiz is a 55 y.o. female who presents today for a complete physical exam.  She reports consuming a atkins diet. Patient states that she is staying active by horse back riding twice a week.  She generally feels fairly well. She reports sleeping poorly. She does have additional problems to discuss today.  Anxiety Patient states that she would like to discuss anxiety and medication management, patient states that she does not want to be on a antidepressant. She has episodes of her heart racing while laying at bed at night. She is concerned that it may be related to extra stress or thyroid issues. Her mother and sister both have a history of thyroid problems. Her anxiety has improved.   Her cardiologist has her on a low dosage of propranolol for her increased heart rate. She is concerned about discussing this issues further with her cardiologist because of the side effects of weight gain and fatigue.   Insomnia  She reports that she has difficulty sleeping at night and has been taking cyclobenzaprine, melatonin and anthistamines to sleep, patient states that she would like to discuss with doctor today about continuing medication. She also noticed that if she doesn't take the cyclobenzaprine at night she has trouble sleep. She occasionally takes it during the day to lower her anxiety.   Hematoma  She reports that when getting off the horse she hit the back of her calf. She says she had a large bruise that lasted a few months. She still has some tenderness occasionally. She questions if this is cause for concern of a blood clot.   Vaccines  She has received 3 COVID vaccines and she is interested in receiving the 4th  booster. She denies having the shingles vaccine.     Past Medical History:  Diagnosis Date  . Dupuytren's contracture of left hand   . Gluten intolerance    Past Surgical History:  Procedure Laterality Date  . CESAREAN SECTION     Social History   Socioeconomic History  . Marital status: Married    Spouse name: Rocky Link  . Number of children: 1  . Years of education: 10  . Highest education level: Bachelor's degree (e.g., BA, AB, BS)  Occupational History  . Occupation: Heritage manager: OTHER    Comment: [employer]  Tobacco Use  . Smoking status: Never Smoker  . Smokeless tobacco: Never Used  Vaping Use  . Vaping Use: Never used  Substance and Sexual Activity  . Alcohol use: Yes    Alcohol/week: 0.0 standard drinks    Comment: rare  . Drug use: No  . Sexual activity: Yes    Partners: Male    Birth control/protection: OCP  Other Topics Concern  . Not on file  Social History Narrative  . Not on file   Social Determinants of Health   Financial Resource Strain: Not on file  Food Insecurity: Not on file  Transportation Needs: Not on file  Physical Activity: Not on file  Stress: Not on file  Social Connections: Not on file  Intimate Partner Violence: Not on file   Family Status  Relation Name Status  .  Mother  Deceased  . Father  Alive  . Sister  Alive  . Mat Uncle  Deceased  . Brother  Alive  . PGM  (Not Specified)  . PGF  (Not Specified)  . MGM  (Not Specified)  . MGF  (Not Specified)  . Neg Hx  (Not Specified)   Family History  Problem Relation Age of Onset  . Arthritis Mother   . Cancer Mother        Lung  . Hypertension Mother   . Kidney disease Mother   . COPD Mother        smoker  . Alzheimer's disease Mother   . Arthritis Father   . Heart disease Father   . COPD Father        smoker  . Lung cancer Father   . Skin cancer Father   . Celiac disease Sister        has been told she is gluten intolerant, not officially  diagnosed  . Kidney disease Sister   . Arthritis Sister   . Kidney disease Maternal Uncle 30  . Healthy Brother   . Heart disease Paternal Grandmother   . Hypertension Paternal Grandmother   . Heart disease Paternal Grandfather   . Hypertension Paternal Grandfather   . Stroke Maternal Grandmother   . Stroke Maternal Grandfather   . Colon cancer Neg Hx   . Breast cancer Neg Hx   . Ovarian cancer Neg Hx   . Cervical cancer Neg Hx   . Drug abuse Neg Hx    Allergies  Allergen Reactions  . Codeine Itching    Can take cough syrup   . Gluten Meal   . Morphine And Related Nausea And Vomiting    Patient Care Team: Erasmo Downer, MD as PCP - General (Family Medicine)   Medications: Outpatient Medications Prior to Visit  Medication Sig  . diphenhydrAMINE (BENADRYL) 25 MG tablet Take 25 mg by mouth at bedtime as needed.  . Melatonin 10 MG TABS Take 10 mg by mouth at bedtime.  . NONFORMULARY OR COMPOUNDED ITEM Estriol 1 mg/gram vaginal cream 1/4 applicator vaginally nightly for two weeks, every other night for two weeks, then twice a week thereafter  . omeprazole (PRILOSEC) 20 MG capsule Take 20 mg by mouth daily.  . propranolol ER (INDERAL LA) 60 MG 24 hr capsule Take 1 capsule (60 mg total) by mouth daily.  . solifenacin (VESICARE) 5 MG tablet Take by mouth.  . [DISCONTINUED] cyclobenzaprine (FLEXERIL) 5 MG tablet Take 1 tablet by mouth three times daily as needed for muscle spasm  . estradiol-norethindrone (ACTIVELLA) 1-0.5 MG tablet Take by mouth.  . [DISCONTINUED] naproxen (NAPROSYN) 500 MG tablet Take 1 tablet (500 mg total) by mouth 2 (two) times daily with a meal.  . [DISCONTINUED] naproxen (NAPROSYN) 500 MG tablet TAKE 1 TABLET BY MOUTH TWICE DAILY WITH A MEAL  . [DISCONTINUED] naproxen (NAPROSYN) 500 MG tablet TAKE 1 TABLET BY MOUTH TWICE DAILY WITH A MEAL  . [DISCONTINUED] naproxen sodium (ALEVE) 220 MG tablet Take 220 mg by mouth at bedtime.  . [DISCONTINUED]  venlafaxine XR (EFFEXOR XR) 37.5 MG 24 hr capsule Take 1 capsule (37.5 mg total) by mouth daily with breakfast.   No facility-administered medications prior to visit.    Review of Systems  Constitutional: Negative for chills, fatigue and fever.  HENT: Negative for ear pain, nosebleeds, rhinorrhea, sinus pressure, sinus pain and sore throat.   Eyes: Negative for pain.  Respiratory: Negative  for cough, chest tightness, shortness of breath and wheezing.   Cardiovascular: Negative for chest pain, palpitations and leg swelling.  Gastrointestinal: Negative for abdominal pain, blood in stool, diarrhea, nausea and vomiting.  Genitourinary: Negative for flank pain, frequency, pelvic pain and urgency.  Musculoskeletal: Negative for back pain, neck pain and neck stiffness.  Neurological: Negative for dizziness, seizures, syncope, weakness, light-headedness, numbness and headaches.      Objective    BP 118/75   Pulse 75   Resp 16   Ht 5\' 5"  (1.651 m)   Wt 144 lb 6.4 oz (65.5 kg)   SpO2 100%   BMI 24.03 kg/m    Physical Exam Vitals reviewed.  Constitutional:      General: She is not in acute distress.    Appearance: Normal appearance. She is well-developed. She is not diaphoretic.  HENT:     Head: Normocephalic and atraumatic.  Eyes:     General: No scleral icterus.    Conjunctiva/sclera: Conjunctivae normal.     Pupils: Pupils are equal, round, and reactive to light.  Neck:     Thyroid: No thyromegaly.  Cardiovascular:     Rate and Rhythm: Normal rate and regular rhythm.     Pulses: Normal pulses.     Heart sounds: Normal heart sounds. No murmur heard.   Pulmonary:     Effort: Pulmonary effort is normal. No respiratory distress.     Breath sounds: Normal breath sounds. No wheezing or rales.  Abdominal:     General: There is no distension.     Palpations: Abdomen is soft.     Tenderness: There is no abdominal tenderness.  Musculoskeletal:        General: No deformity.      Cervical back: Neck supple.     Right lower leg: No edema.     Left lower leg: No edema.  Lymphadenopathy:     Cervical: No cervical adenopathy.  Skin:    General: Skin is warm and dry.     Findings: No rash.  Neurological:     Mental Status: She is alert and oriented to person, place, and time. Mental status is at baseline.     Sensory: No sensory deficit.     Motor: No weakness.     Gait: Gait normal.  Psychiatric:        Mood and Affect: Mood normal.        Behavior: Behavior normal.        Thought Content: Thought content normal.       Last depression screening scores PHQ 2/9 Scores 04/26/2021 04/19/2020 04/26/2019  PHQ - 2 Score 0 0 0  PHQ- 9 Score 2 2 2    Last fall risk screening Fall Risk  04/26/2021  Falls in the past year? 0  Number falls in past yr: 0  Injury with Fall? 0  Follow up -   Last Audit-C alcohol use screening Alcohol Use Disorder Test (AUDIT) 04/26/2021  1. How often do you have a drink containing alcohol? 1  2. How many drinks containing alcohol do you have on a typical day when you are drinking? 0  3. How often do you have six or more drinks on one occasion? 0  AUDIT-C Score 1   A score of 3 or more in women, and 4 or more in men indicates increased risk for alcohol abuse, EXCEPT if all of the points are from question 1   No results found for any visits on 04/26/21.  Assessment & Plan     Problem List Items Addressed This Visit      Other   Palpitations    Previously had workup revealing PACs Will check TSH, CBC, CMP today to ensure no other abnormality      Relevant Orders   Comprehensive metabolic panel   TSH   CBC w/Diff/Platelet   Adjustment disorder with mixed anxiety and depressed mood    Much improved No longer on effexor Hydroxyzine prn       Other Visit Diagnoses    Encounter for annual physical exam    -  Primary   Relevant Orders   Comprehensive metabolic panel   Lipid panel   TSH   CBC w/Diff/Platelet      Routine  Health Maintenance and Physical Exam  Exercise Activities and Dietary recommendations Goals   None     Immunization History  Administered Date(s) Administered  . Influenza Inj Mdck Quad Pf 12/10/2018  . Influenza Split 09/25/2012  . Influenza,inj,Quad PF,6+ Mos 09/30/2013, 09/30/2014, 11/13/2015, 11/29/2016, 10/14/2019  . PFIZER(Purple Top)SARS-COV-2 Vaccination 02/28/2020, 03/21/2020  . Tdap 09/25/2012    Health Maintenance  Topic Date Due  . Zoster Vaccines- Shingrix (1 of 2) Never done  . COVID-19 Vaccine (3 - Booster for Pfizer series) 08/21/2020  . INFLUENZA VACCINE  06/25/2021  . TETANUS/TDAP  09/25/2022  . MAMMOGRAM  01/05/2023  . PAP SMEAR-Modifier  10/02/2023  . COLONOSCOPY (Pts 45-40yrs Insurance coverage will need to be confirmed)  01/13/2025  . Hepatitis C Screening  Completed  . HIV Screening  Completed  . HPV VACCINES  Aged Out    Discussed health benefits of physical activity, and encouraged her to engage in regular exercise appropriate for her age and condition.    Return in about 1 year (around 04/26/2022) for CPE.      I,Essence Turner,acting as a Neurosurgeon for Shirlee Latch, MD.,have documented all relevant documentation on the behalf of Shirlee Latch, MD,as directed by  Shirlee Latch, MD while in the presence of Shirlee Latch, MD.   I, Shirlee Latch, MD, have reviewed all documentation for this visit. The documentation on 04/26/21 for the exam, diagnosis, procedures, and orders are all accurate and complete.   Saahas Hidrogo, Marzella Schlein, MD, MPH [facility]

## 2021-04-26 NOTE — Patient Instructions (Addendum)
The CDC recommends two doses of Shingrix (the shingles vaccine) separated by 2 to 6 months for adults age 57 years and older. I recommend checking with your insurance plan regarding coverage for this vaccine.     Preventive Care 57-65 Years Old, Female Preventive care refers to lifestyle choices and visits with your health care provider that can promote health and wellness. This includes:  A yearly physical exam. This is also called an annual wellness visit.  Regular dental and eye exams.  Immunizations.  Screening for certain conditions.  Healthy lifestyle choices, such as: ? Eating a healthy diet. ? Getting regular exercise. ? Not using drugs or products that contain nicotine and tobacco. ? Limiting alcohol use. What can I expect for my preventive care visit? Physical exam Your health care provider will check your:  Height and weight. These may be used to calculate your BMI (body mass index). BMI is a measurement that tells if you are at a healthy weight.  Heart rate and blood pressure.  Body temperature.  Skin for abnormal spots. Counseling Your health care provider may ask you questions about your:  Past medical problems.  Family's medical history.  Alcohol, tobacco, and drug use.  Emotional well-being.  Home life and relationship well-being.  Sexual activity.  Diet, exercise, and sleep habits.  Work and work Statistician.  Access to firearms.  Method of birth control.  Menstrual cycle.  Pregnancy history. What immunizations do I need? Vaccines are usually given at various ages, according to a schedule. Your health care provider will recommend vaccines for you based on your age, medical history, and lifestyle or other factors, such as travel or where you work.   What tests do I need? Blood tests  Lipid and cholesterol levels. These may be checked every 5 years, or more often if you are over 20 years old.  Hepatitis C test.  Hepatitis B  test. Screening  Lung cancer screening. You may have this screening every year starting at age 16 if you have a 30-pack-year history of smoking and currently smoke or have quit within the past 15 years.  Colorectal cancer screening. ? All adults should have this screening starting at age 57 and continuing until age 64. ? Your health care provider may recommend screening at age 21 if you are at increased risk. ? You will have tests every 1-10 years, depending on your results and the type of screening test.  Diabetes screening. ? This is done by checking your blood sugar (glucose) after you have not eaten for a while (fasting). ? You may have this done every 1-3 years.  Mammogram. ? This may be done every 1-2 years. ? Talk with your health care provider about when you should start having regular mammograms. This may depend on whether you have a family history of breast cancer.  BRCA-related cancer screening. This may be done if you have a family history of breast, ovarian, tubal, or peritoneal cancers.  Pelvic exam and Pap test. ? This may be done every 3 years starting at age 36. ? Starting at age 20, this may be done every 5 years if you have a Pap test in combination with an HPV test. Other tests  STD (sexually transmitted disease) testing, if you are at risk.  Bone density scan. This is done to screen for osteoporosis. You may have this scan if you are at high risk for osteoporosis. Talk with your health care provider about your test results, treatment options,  and if necessary, the need for more tests. Follow these instructions at home: Eating and drinking  Eat a diet that includes fresh fruits and vegetables, whole grains, lean protein, and low-fat dairy products.  Take vitamin and mineral supplements as recommended by your health care provider.  Do not drink alcohol if: ? Your health care provider tells you not to drink. ? You are pregnant, may be pregnant, or are planning  to become pregnant.  If you drink alcohol: ? Limit how much you have to 0-1 drink a day. ? Be aware of how much alcohol is in your drink. In the [place] one drink equals one 12 oz bottle of beer (355 mL), one 5 oz glass of wine (148 mL), or one 1 oz glass of hard liquor (44 mL).   Lifestyle  Take daily care of your teeth and gums. Brush your teeth every morning and night with fluoride toothpaste. Floss one time each day.  Stay active. Exercise for at least 30 minutes 5 or more days each week.  Do not use any products that contain nicotine or tobacco, such as cigarettes, e-cigarettes, and chewing tobacco. If you need help quitting, ask your health care provider.  Do not use drugs.  If you are sexually active, practice safe sex. Use a condom or other form of protection to prevent STIs (sexually transmitted infections).  If you do not wish to become pregnant, use a form of birth control. If you plan to become pregnant, see your health care provider for a prepregnancy visit.  If told by your health care provider, take low-dose aspirin daily starting at age 40.  Find healthy ways to cope with stress, such as: ? Meditation, yoga, or listening to music. ? Journaling. ? Talking to a trusted person. ? Spending time with friends and family. Safety  Always wear your seat belt while driving or riding in a vehicle.  Do not drive: ? If you have been drinking alcohol. Do not ride with someone who has been drinking. ? When you are tired or distracted. ? While texting.  Wear a helmet and other protective equipment during sports activities.  If you have firearms in your house, make sure you follow all gun safety procedures. What's next?  Visit your health care provider once a year for an annual wellness visit.  Ask your health care provider how often you should have your eyes and teeth checked.  Stay up to date on all vaccines. This information is not intended to replace advice given to you  by your health care provider. Make sure you discuss any questions you have with your health care provider. Document Revised: 08/15/2020 Document Reviewed: 07/23/2018 Elsevier Patient Education  2021 [employer].

## 2021-04-26 NOTE — Assessment & Plan Note (Signed)
Previously had workup revealing PACs Will check TSH, CBC, CMP today to ensure no other abnormality

## 2021-04-26 NOTE — Assessment & Plan Note (Signed)
Much improved No longer on effexor Hydroxyzine prn

## 2021-04-27 ENCOUNTER — Encounter: Payer: Self-pay | Admitting: Family Medicine

## 2021-04-27 DIAGNOSIS — L570 Actinic keratosis: Secondary | ICD-10-CM

## 2021-04-27 LAB — COMPREHENSIVE METABOLIC PANEL
ALT: 17 IU/L (ref 0–32)
AST: 22 IU/L (ref 0–40)
Albumin/Globulin Ratio: 1.6 (ref 1.2–2.2)
Albumin: 4.4 g/dL (ref 3.8–4.9)
Alkaline Phosphatase: 95 IU/L (ref 44–121)
BUN/Creatinine Ratio: 21 (ref 9–23)
BUN: 17 mg/dL (ref 6–24)
Bilirubin Total: <0.2 mg/dL (ref 0.0–1.2)
CO2: 23 mmol/L (ref 20–29)
Calcium: 9.3 mg/dL (ref 8.7–10.2)
Chloride: 99 mmol/L (ref 96–106)
Creatinine, Ser: 0.8 mg/dL (ref 0.57–1.00)
Globulin, Total: 2.7 g/dL (ref 1.5–4.5)
Glucose: 105 mg/dL — ABNORMAL HIGH (ref 65–99)
Potassium: 4.4 mmol/L (ref 3.5–5.2)
Sodium: 137 mmol/L (ref 134–144)
Total Protein: 7.1 g/dL (ref 6.0–8.5)
eGFR: 86 mL/min/{1.73_m2} (ref >59)

## 2021-04-27 LAB — CBC WITH DIFFERENTIAL/PLATELET
Basophils Absolute: 0.1 10*3/uL (ref 0.0–0.2)
Basos: 1 %
EOS (ABSOLUTE): 0.2 10*3/uL (ref 0.0–0.4)
Eos: 4 %
Hematocrit: 38 % (ref 34.0–46.6)
Hemoglobin: 12.5 g/dL (ref 11.1–15.9)
Immature Grans (Abs): 0 10*3/uL (ref 0.0–0.1)
Immature Granulocytes: 0 %
Lymphocytes Absolute: 1.3 10*3/uL (ref 0.7–3.1)
Lymphs: 22 %
MCH: 29.8 pg (ref 26.6–33.0)
MCHC: 32.9 g/dL (ref 31.5–35.7)
MCV: 91 fL (ref 79–97)
Monocytes Absolute: 0.4 10*3/uL (ref 0.1–0.9)
Monocytes: 7 %
Neutrophils Absolute: 4.1 10*3/uL (ref 1.4–7.0)
Neutrophils: 66 %
Platelets: 285 10*3/uL (ref 150–450)
RBC: 4.19 x10E6/uL (ref 3.77–5.28)
RDW: 12.3 % (ref 11.7–15.4)
WBC: 6.1 10*3/uL (ref 3.4–10.8)

## 2021-04-27 LAB — LIPID PANEL
Chol/HDL Ratio: 4 ratio (ref 0.0–4.4)
Cholesterol, Total: 174 mg/dL (ref 100–199)
HDL: 44 mg/dL (ref >39)
LDL Chol Calc (NIH): 101 mg/dL — ABNORMAL HIGH (ref 0–99)
Triglycerides: 168 mg/dL — ABNORMAL HIGH (ref 0–149)
VLDL Cholesterol Cal: 29 mg/dL (ref 5–40)

## 2021-04-27 LAB — TSH: TSH: 2.54 u[IU]/mL (ref 0.450–4.500)

## 2021-04-30 ENCOUNTER — Encounter: Payer: PRIVATE HEALTH INSURANCE | Admitting: Family Medicine

## 2021-05-29 ENCOUNTER — Encounter: Payer: Self-pay | Admitting: Family Medicine

## 2021-07-02 ENCOUNTER — Ambulatory Visit (INDEPENDENT_AMBULATORY_CARE_PROVIDER_SITE_OTHER): Payer: PRIVATE HEALTH INSURANCE | Admitting: Internal Medicine

## 2021-07-02 ENCOUNTER — Encounter: Payer: Self-pay | Admitting: Family Medicine

## 2021-07-02 ENCOUNTER — Encounter: Payer: Self-pay | Admitting: Internal Medicine

## 2021-07-02 ENCOUNTER — Other Ambulatory Visit: Payer: Self-pay

## 2021-07-02 ENCOUNTER — Ambulatory Visit: Payer: Self-pay | Admitting: *Deleted

## 2021-07-02 VITALS — BP 122/83 | HR 67 | Temp 98.5°F | Ht 64.57 in | Wt 142.4 lb

## 2021-07-02 DIAGNOSIS — R21 Rash and other nonspecific skin eruption: Secondary | ICD-10-CM

## 2021-07-02 MED ORDER — CEPHALEXIN 500 MG PO CAPS: 500.0000 mg | ORAL_CAPSULE | Freq: Two times a day (BID) | ORAL | 0 refills | Status: AC

## 2021-07-02 MED ORDER — FEXOFENADINE HCL 180 MG PO TABS: 180.0000 mg | ORAL_TABLET | Freq: Every day | ORAL | 0 refills | Status: AC

## 2021-07-02 MED ORDER — MUPIROCIN CALCIUM 2 % EX CREA: 1.0000 "application " | TOPICAL_CREAM | Freq: Two times a day (BID) | CUTANEOUS | 0 refills | Status: DC

## 2021-07-02 NOTE — Telephone Encounter (Signed)
Looks like she has appt at [facility] this afternoon. Thanks!

## 2021-07-02 NOTE — Telephone Encounter (Signed)
Patient is calling to report that she has swelling in nose and eyes. Patient states the swelling started on left side of nose and has progressively gotten worse over the weekend. Patient states that she had swelling that blocked her nasal passage and made it hard to breath through her nose this weekend. The skin is tender to touch and the swelling is spreading to her eyes. Call to office- they request call note to see if she can be scheduled at [facility] today. Patient is aware she will get call back.

## 2021-07-02 NOTE — Progress Notes (Signed)
BP 122/83   Pulse 67   Temp 98.5 F (36.9 C) (Oral)   Ht 5' 4.57" (1.64 m)   Wt 142 lb 6.4 oz (64.6 kg)   SpO2 99%   BMI 24.02 kg/m    Subjective:    Patient ID: Leslie Ruiz, female    DOB: 10-29-1966, 55 y.o.   MRN: [id number]  Chief Complaint  Patient presents with  . Facial Swelling    Patient states that her nose, eyes, and cheeks have been red and swollen since Thurday. Patient also states that these area are tender to the touch    HPI: Leslie Ruiz is a 55 y.o. female  Patient presents with: Facial Swelling: Patient states that her nose, eyes, and cheeks have been red and swollen since Thurday. Patient also states that these area are tender to the touch  Skins tender and swelling in the nasal bridge, sensitive to touch, has had not had fever or chills, has not felt no lips swelling, is around horses a lot, rides them they are boarded in [place].      Chief Complaint  Patient presents with  . Facial Swelling    Patient states that her nose, eyes, and cheeks have been red and swollen since Thurday. Patient also states that these area are tender to the touch    Relevant past medical, surgical, family and social history reviewed and updated as indicated. Interim medical history since our last visit reviewed. Allergies and medications reviewed and updated.  Review of Systems  Per HPI unless specifically indicated above     Objective:    BP 122/83   Pulse 67   Temp 98.5 F (36.9 C) (Oral)   Ht 5' 4.57" (1.64 m)   Wt 142 lb 6.4 oz (64.6 kg)   SpO2 99%   BMI 24.02 kg/m   Wt Readings from Last 3 Encounters:  07/02/21 142 lb 6.4 oz (64.6 kg)  04/26/21 144 lb 6.4 oz (65.5 kg)  08/05/20 144 lb (65.3 kg)    Physical Exam Vitals and nursing note reviewed.  Constitutional:      General: She is not in acute distress.    Appearance: Normal appearance. She is not ill-appearing or diaphoretic.  Eyes:     Conjunctiva/sclera: Conjunctivae normal.   Pulmonary:     Effort: No respiratory distress.  Skin:    General: Skin is warm and dry.     Coloration: Skin is not jaundiced.     Findings: Erythema and rash present.  Neurological:     Mental Status: She is alert.   Results for orders placed or performed in visit on 04/26/21  Comprehensive metabolic panel  Result Value Ref Range   Glucose 105 (H) 65 - 99 mg/dL   BUN 17 6 - 24 mg/dL   Creatinine, Ser 0.80 0.57 - 1.00 mg/dL   eGFR 86 >59 mL/min/1.73   BUN/Creatinine Ratio 21 9 - 23   Sodium 137 134 - 144 mmol/L   Potassium 4.4 3.5 - 5.2 mmol/L   Chloride 99 96 - 106 mmol/L   CO2 23 20 - 29 mmol/L   Calcium 9.3 8.7 - 10.2 mg/dL   Total Protein 7.1 6.0 - 8.5 g/dL   Albumin 4.4 3.8 - 4.9 g/dL   Globulin, Total 2.7 1.5 - 4.5 g/dL   Albumin/Globulin Ratio 1.6 1.2 - 2.2   Bilirubin Total <0.2 0.0 - 1.2 mg/dL   Alkaline Phosphatase 95 44 - 121 IU/L   AST 22  0 - 40 IU/L   ALT 17 0 - 32 IU/L  Lipid panel  Result Value Ref Range   Cholesterol, Total 174 100 - 199 mg/dL   Triglycerides 168 (H) 0 - 149 mg/dL   HDL 44 >39 mg/dL   VLDL Cholesterol Cal 29 5 - 40 mg/dL   LDL Chol Calc (NIH) 101 (H) 0 - 99 mg/dL   Chol/HDL Ratio 4.0 0.0 - 4.4 ratio  TSH  Result Value Ref Range   TSH 2.540 0.450 - 4.500 uIU/mL  CBC w/Diff/Platelet  Result Value Ref Range   WBC 6.1 3.4 - 10.8 x10E3/uL   RBC 4.19 3.77 - 5.28 x10E6/uL   Hemoglobin 12.5 11.1 - 15.9 g/dL   Hematocrit 38.0 34.0 - 46.6 %   MCV 91 79 - 97 fL   MCH 29.8 26.6 - 33.0 pg   MCHC 32.9 31.5 - 35.7 g/dL   RDW 12.3 11.7 - 15.4 %   Platelets 285 150 - 450 x10E3/uL   Neutrophils 66 Not Estab. %   Lymphs 22 Not Estab. %   Monocytes 7 Not Estab. %   Eos 4 Not Estab. %   Basos 1 Not Estab. %   Neutrophils Absolute 4.1 1.4 - 7.0 x10E3/uL   Lymphocytes Absolute 1.3 0.7 - 3.1 x10E3/uL   Monocytes Absolute 0.4 0.1 - 0.9 x10E3/uL   EOS (ABSOLUTE) 0.2 0.0 - 0.4 x10E3/uL   Basophils Absolute 0.1 0.0 - 0.2 x10E3/uL   Immature  Granulocytes 0 Not Estab. %   Immature Grans (Abs) 0.0 0.0 - 0.1 x10E3/uL        Current Outpatient Medications:  .  cyclobenzaprine (FLEXERIL) 5 MG tablet, Take 1 tablet (5 mg total) by mouth 3 (three) times daily as needed. for muscle spams, Disp: 30 tablet, Rfl: 5 .  diphenhydrAMINE (BENADRYL) 25 MG tablet, Take 25 mg by mouth at bedtime as needed., Disp: , Rfl:  .  hydrOXYzine (ATARAX/VISTARIL) 10 MG tablet, Take 1 tablet (10 mg total) by mouth 3 (three) times daily as needed for anxiety., Disp: 30 tablet, Rfl: 0 .  Melatonin 10 MG TABS, Take 10 mg by mouth at bedtime., Disp: , Rfl:  .  NONFORMULARY OR COMPOUNDED ITEM, Estriol 1 mg/gram vaginal cream 1/4 applicator vaginally nightly for two weeks, every other night for two weeks, then twice a week thereafter, Disp: , Rfl:  .  omeprazole (PRILOSEC) 20 MG capsule, Take 20 mg by mouth daily., Disp: , Rfl:  .  propranolol ER (INDERAL LA) 60 MG 24 hr capsule, Take 1 capsule (60 mg total) by mouth daily., Disp: 90 capsule, Rfl: 1 .  solifenacin (VESICARE) 5 MG tablet, Take by mouth., Disp: , Rfl:  .  estradiol-norethindrone (ACTIVELLA) 1-0.5 MG tablet, Take by mouth., Disp: , Rfl:     Assessment & Plan:  Rash : ? Cellulitis of skin Will start pt on keflex Use allegra for itching  Fu with pcp if worsens.    Problem List Items Addressed This Visit   None    No orders of the defined types were placed in this encounter.    No orders of the defined types were placed in this encounter.    Follow up plan: No follow-ups on file.

## 2021-07-02 NOTE — Telephone Encounter (Signed)
Appointment confirmed with [facility]

## 2021-07-02 NOTE — Telephone Encounter (Signed)
Reason for Disposition  [1] Looks infected AND [2] large red area (> 2 in. or 5 cm)  Answer Assessment - Initial Assessment Questions 1. ONSET: "When did the swelling start?" (e.g., minutes, hours, days)     Thursday 2. LOCATION: "What part of the face is swollen?"     Nose and eyes- worse on left side 3. SEVERITY: "How swollen is it?"     Puffy- hurts to wear glasses 4. ITCHING: "Is there any itching?" If Yes, ask: "How much?"   (Scale 1-10; mild, moderate or severe)     No itching 5. PAIN: "Is the swelling painful to touch?" If Yes, ask: "How painful is it?"   (Scale 1-10; mild, moderate or severe)   - NONE (0): no pain   - MILD (1-3): doesn't interfere with normal activities    - MODERATE (4-7): interferes with normal activities or awakens from sleep    - SEVERE (8-10): excruciating pain, unable to do any normal activities      Skin is tender 6. FEVER: "Do you have a fever?" If Yes, ask: "What is it, how was it measured, and when did it start?"      no 7. CAUSE: "What do you think is causing the face swelling?"     unsure 8. RECURRENT SYMPTOM: "Have you had face swelling before?" If Yes, ask: "When was the last time?" "What happened that time?"     no 9. OTHER SYMPTOMS: "Do you have any other symptoms?" (e.g., toothache, leg swelling)     Eyes cloudy 10. PREGNANCY: "Is there any chance you are pregnant?" "When was your last menstrual period?"       N/a  Protocols used: Face Swelling-A-AH

## 2021-07-05 ENCOUNTER — Other Ambulatory Visit: Payer: PRIVATE HEALTH INSURANCE

## 2021-07-05 MED ORDER — MUPIROCIN 2 % EX OINT: 1.0000 "application " | TOPICAL_OINTMENT | Freq: Two times a day (BID) | CUTANEOUS | 0 refills | Status: DC

## 2021-07-05 NOTE — Telephone Encounter (Signed)
Routing to provider who saw the patient. RX for Bactroban ointment started.

## 2021-07-05 NOTE — Telephone Encounter (Signed)
Patient notified that meds have been sent to the pharmacy.

## 2021-07-05 NOTE — Telephone Encounter (Signed)
Copied from CRM [id number]. Topic: General - Other >> Jul 05, 2021 10:32 AM Leslie Ruiz wrote: Reason for CRM: Patient would like to be contacted regarding their mupirocin cream (BACTROBAN) 2 %  prescription   The patient would like to be prescribed an ointment instead of Ruiz cream for cost effectiveness  Please contact further when possible

## 2021-08-28 ENCOUNTER — Encounter: Payer: Self-pay | Admitting: Internal Medicine

## 2021-08-28 DIAGNOSIS — R21 Rash and other nonspecific skin eruption: Secondary | ICD-10-CM | POA: Insufficient documentation

## 2021-12-06 ENCOUNTER — Encounter: Payer: Self-pay | Admitting: Family Medicine

## 2021-12-14 ENCOUNTER — Ambulatory Visit
Admission: EM | Admit: 2021-12-14 | Discharge: 2021-12-14 | Disposition: A | Discharge status: Home / Self Care | Payer: 59 | Attending: Emergency Medicine | Admitting: Emergency Medicine

## 2021-12-14 ENCOUNTER — Other Ambulatory Visit: Payer: Self-pay

## 2021-12-14 ENCOUNTER — Encounter: Payer: Self-pay | Admitting: Emergency Medicine

## 2021-12-14 DIAGNOSIS — J0100 Acute maxillary sinusitis, unspecified: Secondary | ICD-10-CM

## 2021-12-14 MED ORDER — GUAIFENESIN ER 600 MG PO TB12: 600.0000 mg | ORAL_TABLET | Freq: Two times a day (BID) | ORAL | 0 refills | Status: DC

## 2021-12-14 MED ORDER — AMOXICILLIN-POT CLAVULANATE 875-125 MG PO TABS: 1.0000 | ORAL_TABLET | Freq: Two times a day (BID) | ORAL | 0 refills | Status: DC

## 2021-12-14 NOTE — ED Provider Notes (Signed)
[facility]    CSN: [id number] Arrival date & time: 12/14/21  1029      History   Chief Complaint Chief Complaint  Patient presents with   Cough    HPI SHOLANDA BAGNASCO is a 56 y.o. female.   Patient presents with nonproductive cough, chest congestion, sinus pain and pressure and sinus headaches for 3 weeks.  Associated sore throat predominantly on the right side upon awakening every morning.  Endorses that symptoms are worsening.  Tolerating food and liquids.  No known sick contacts.  Has attempted use of Claritin, nasal spray, Tylenol and Aleve which has not been helpful in resolution of symptoms.  Home COVID test negative.   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      Home Medications    Prior to Admission medications   Medication Sig Start Date End Date Taking? Authorizing Provider  cyclobenzaprine (FLEXERIL) 5 MG tablet Take 1 tablet (5 mg total) by mouth 3 (three) times daily as needed. for muscle spams 04/26/21  Yes Bacigalupo, Dionne Bucy, MD  estradiol-norethindrone (ACTIVELLA) 1-0.5 MG tablet Take by mouth. 06/02/19 12/14/21 Yes [provider]  propranolol ER (INDERAL LA) 60 MG 24 hr capsule Take 1 capsule (60 mg total) by mouth daily. 04/26/20  Yes Bacigalupo, Dionne Bucy, MD  diphenhydrAMINE (BENADRYL) 25 MG tablet Take 25 mg by mouth at bedtime as needed.    [provider]  fexofenadine (ALLEGRA ALLERGY) 180 MG tablet Take 1 tablet (180 mg total) by mouth daily. 07/02/21   Vigg, Avanti, MD  hydrOXYzine (ATARAX/VISTARIL) 10 MG tablet Take 1 tablet (10 mg total) by mouth 3 (three) times daily as needed for anxiety. 04/26/21   Virginia Crews, MD  Melatonin 10 MG TABS Take 10 mg by mouth at bedtime.    [provider]  mupirocin ointment (BACTROBAN) 2 % Apply 1 application topically 2 (two) times daily. 07/05/21   Charlynne Cousins, MD  NONFORMULARY OR COMPOUNDED ITEM Estriol 1 mg/gram vaginal cream 1/4 applicator vaginally nightly for two weeks, every other night for two weeks, then twice a week thereafter 12/31/17   [provider]  omeprazole (PRILOSEC) 20 MG capsule Take 20 mg by mouth daily.    [provider]  solifenacin (VESICARE) 5 MG tablet Take by mouth. 10/05/19   [provider]    Family History Family History  Problem Relation Age of Onset   Arthritis Mother    Cancer Mother        Lung   Hypertension Mother    Kidney disease Mother    COPD Mother        smoker   Alzheimer's disease Mother    Arthritis Father    Heart disease Father    COPD Father        smoker   Lung cancer Father    Skin cancer Father  Celiac disease Sister        has been told she is gluten intolerant, not officially diagnosed   Kidney disease Sister    Arthritis Sister    Kidney disease Maternal Uncle 41   Healthy Brother    Heart disease Paternal Grandmother    Hypertension Paternal Grandmother    Heart disease Paternal Grandfather    Hypertension Paternal Grandfather    Stroke Maternal Grandmother    Stroke Maternal Grandfather    Colon cancer Neg Hx    Breast cancer Neg Hx    Ovarian cancer Neg Hx    Cervical cancer Neg Hx    Drug abuse Neg Hx     Social History Social History   Tobacco Use   Smoking status: Never   Smokeless tobacco: Never  Vaping Use   Vaping Use: Never used  Substance Use Topics   Alcohol  use: Yes    Alcohol/week: 0.0 standard drinks    Comment: rare   Drug use: No     Allergies   Codeine, Gluten meal, and Morphine and related   Review of Systems Review of Systems  Constitutional: Negative.   HENT:  Positive for congestion, rhinorrhea, sinus pressure, sinus pain and sore throat. Negative for dental problem, drooling, ear discharge, ear pain, facial swelling, hearing loss, mouth sores, nosebleeds, postnasal drip, sneezing, tinnitus, trouble swallowing and voice change.   Respiratory:  Positive for cough. Negative for apnea, choking, chest tightness, shortness of breath, wheezing and stridor.   Cardiovascular: Negative.   Gastrointestinal: Negative.   Skin: Negative.   Neurological:  Positive for headaches. Negative for dizziness, tremors, seizures, syncope, facial asymmetry, speech difficulty, weakness, light-headedness and numbness.    Physical Exam Triage Vital Signs ED Triage Vitals  Enc Vitals Group     BP 12/14/21 1043 126/78     Pulse Rate 12/14/21 1043 91     Resp 12/14/21 1043 14     Temp 12/14/21 1043 98.1 F (36.7 C)     Temp Source 12/14/21 1043 Oral     SpO2 12/14/21 1043 100 %     Weight 12/14/21 1040 150 lb (68 kg)     Height 12/14/21 1040 5\' 5"  (1.651 m)     Head Circumference --      Peak Flow --      Pain Score 12/14/21 1040 6     Pain Loc --      Pain Edu? --      Excl. in [place]? --    No data found.  Updated Vital Signs BP 126/78 (BP Location: Left Arm)    Pulse 91    Temp 98.1 F (36.7 C) (Oral)    Resp 14    Ht 5\' 5"  (1.651 m)    Wt 150 lb (68 kg)    SpO2 100%    BMI 24.96 kg/m   Visual Acuity Right Eye Distance:   Left Eye Distance:   Bilateral Distance:    Right Eye Near:   Left Eye Near:    Bilateral Near:     Physical Exam Constitutional:      Appearance: Normal appearance. She is normal weight.  HENT:     Head: Normocephalic.     Right Ear: Tympanic membrane, ear canal and external ear normal.     Left Ear: Tympanic  membrane, ear canal and external ear normal.     Nose: Congestion present. No rhinorrhea.     Mouth/Throat:     Mouth: Mucous  membranes are moist.     Pharynx: Oropharynx is clear.  Eyes:     Extraocular Movements: Extraocular movements intact.  Cardiovascular:     Rate and Rhythm: Normal rate and regular rhythm.     Pulses: Normal pulses.     Heart sounds: Normal heart sounds.  Pulmonary:     Effort: Pulmonary effort is normal.     Breath sounds: Normal breath sounds.  Musculoskeletal:     Cervical back: Normal range of motion and neck supple.  Skin:    General: Skin is warm and dry.  Neurological:     Mental Status: She is alert and oriented to person, place, and time. Mental status is at baseline.  Psychiatric:        Mood and Affect: Mood normal.        Behavior: Behavior normal.     UC Treatments / Results  Labs (all labs ordered are listed, but only abnormal results are displayed) Labs Reviewed - No data to display  EKG   Radiology No results found.  Procedures Procedures (including critical care time)  Medications Ordered in UC Medications - No data to display  Initial Impression / Assessment and Plan / UC Course  I have reviewed the triage vital signs and the nursing notes.  Pertinent labs & imaging results that were available during my care of the patient were reviewed by me and considered in my medical decision making (see chart for details).  Acute nonrecurrent maxillary sinusitis  Prescribed Augmentin 7-day course as well as Mucinex twice daily, may continue use of antihistamine and nasal spray, discussed discontinuing of nasal spray dependent on brand due to possibility of rebound congestion, may attempt saline irrigation in addition, declined work note, may follow-up with urgent care as needed Final Clinical Impressions(s) / UC Diagnoses   Final diagnoses:  None   Discharge Instructions   None    ED Prescriptions   None    PDMP not  reviewed this encounter.   Hans Eden, NP 12/14/21 1126

## 2021-12-14 NOTE — Discharge Instructions (Signed)
The Augmentin twice daily with food for 10 days for treatment of your sinusitis.  Take Mucinex twice a day to help thin secretions  You may attempt any of the following below in addition  Perform sinus irrigation 2-3 times a day with a [employer] sinus rinse kit and distilled water.  Do not use tap water.  Increase your oral fluid intake to thin out your mucus so that is also able for your body to clear more easily.    You may use Flonase daily, please check the nasal spray that you have been using as many of the other brands will cause rebound congestion, as needed for nasal and sinus congestion.  If you develop any new or worsening symptoms return for reevaluation or see your primary care provider.

## 2021-12-14 NOTE — ED Triage Notes (Signed)
Patient c/o cough and chest congestion for 3 weeks.  Patient states that she took home covid test this morning and was negative.  Patient also reports inus pressure and congestion. Patient denies fevers.

## 2022-01-09 DIAGNOSIS — I491 Atrial premature depolarization: Secondary | ICD-10-CM | POA: Diagnosis not present

## 2022-01-09 DIAGNOSIS — Z23 Encounter for immunization: Secondary | ICD-10-CM | POA: Diagnosis not present

## 2022-01-09 DIAGNOSIS — R002 Palpitations: Secondary | ICD-10-CM | POA: Diagnosis not present

## 2022-01-10 ENCOUNTER — Other Ambulatory Visit: Payer: Self-pay | Admitting: Family Medicine

## 2022-02-07 ENCOUNTER — Other Ambulatory Visit: Payer: Self-pay | Admitting: Family Medicine

## 2022-02-07 NOTE — Telephone Encounter (Signed)
Requested medication (s) are due for refill today: yes ? ?Requested medication (s) are on the active medication list: yes ? ?Last refill:  01/10/22 #30/0 ? ?Future visit scheduled: yes ? ?Notes to clinic:  Unable to refill per protocol, cannot delegate. ? ? ? ?  ?Requested Prescriptions  ?Pending Prescriptions Disp Refills  ? cyclobenzaprine (FLEXERIL) 5 MG tablet [Pharmacy Med Name: Cyclobenzaprine HCl 5 MG Oral Tablet] 30 tablet 0  ?  Sig: Take 1 tablet by mouth three times daily as needed for muscle spasm  ?  ? Not Delegated - Analgesics:  Muscle Relaxants Failed - 02/07/2022 10:22 AM  ?  ?  Failed - This refill cannot be delegated  ?  ?  Failed - Valid encounter within last 6 months  ?  Recent Outpatient Visits   ? ?      ? 7 months ago Rash  ? [facility] Vigg, Avanti, MD  ? 9 months ago Encounter for annual physical exam  ? [facility] [place], Marzella Schlein, MD  ? 1 year ago Annual physical exam  ? [facility] [place], Marzella Schlein, MD  ? 1 year ago Trapezius muscle strain, right, initial encounter  ? [facility] [place], Marzella Schlein, MD  ? 2 years ago Anti-TPO antibodies present  ? [facility] Bacigalupo, Marzella Schlein, MD  ? ?  ?  ?Future Appointments   ? ?        ? In 2 months Bacigalupo, Marzella Schlein, MD [facility]  ? ?  ? ?  ?  ?  ? ?

## 2022-03-14 ENCOUNTER — Other Ambulatory Visit: Payer: Self-pay | Admitting: Family Medicine

## 2022-03-14 NOTE — Telephone Encounter (Signed)
Requested medications are due for refill today.  yes ? ?Requested medications are on the active medications list.  yes ? ?Last refill. 02/07/2022 #30 0 refills ? ?Future visit scheduled.   yes ? ?Notes to clinic.  Medication refill is not delegated. ? ? ? ?Requested Prescriptions  ?Pending Prescriptions Disp Refills  ? cyclobenzaprine (FLEXERIL) 5 MG tablet [Pharmacy Med Name: Cyclobenzaprine HCl 5 MG Oral Tablet] 30 tablet 0  ?  Sig: Take 1 tablet by mouth three times daily as needed for muscle spasm  ?  ? Not Delegated - Analgesics:  Muscle Relaxants Failed - 03/14/2022 10:23 AM  ?  ?  Failed - This refill cannot be delegated  ?  ?  Failed - Valid encounter within last 6 months  ?  Recent Outpatient Visits   ? ?      ? 8 months ago Rash  ? [facility] Vigg, Avanti, MD  ? 10 months ago Encounter for annual physical exam  ? [facility] [place], Marzella Schlein, MD  ? 1 year ago Annual physical exam  ? [facility] [employer], Marzella Schlein, MD  ? 2 years ago Trapezius muscle strain, right, initial encounter  ? [facility] [place], Marzella Schlein, MD  ? 2 years ago Anti-TPO antibodies present  ? [facility] Bacigalupo, Marzella Schlein, MD  ? ?  ?  ?Future Appointments   ? ?        ? In 1 month Bacigalupo, Marzella Schlein, MD [facility]  ? ?  ? ? ?  ?  ?  ?  ?

## 2022-04-26 NOTE — Progress Notes (Signed)
I,Sulibeya S Dimas,acting as a Education administrator for Lavon Paganini, MD.,have documented all relevant documentation on the behalf of Lavon Paganini, MD,as directed by  Lavon Paganini, MD while in the presence of Lavon Paganini, MD.   Complete physical exam   Patient: Leslie Ruiz   DOB: 03/22/1966   56 y.o. Female  MRN: [id number] Visit Date: 04/29/2022  Today's healthcare provider: Lavon Paganini, MD   Chief Complaint  Patient presents with   Annual Exam   Subjective    LARETHA LUEPKE is a 56 y.o. female who presents today for a complete physical exam.  She reports consuming a  gluten, protein shakes  diet. Home exercise routine includes walking and horse riding. She generally feels well. She reports sleeping fairly well. She does not have additional problems to discuss today.   HPI    Past Medical History:  Diagnosis Date   Dupuytren's contracture of left hand    Gluten intolerance    Past Surgical History:  Procedure Laterality Date   CESAREAN SECTION     Social History   Socioeconomic History   Marital status: Married    Spouse name: Yvone Neu   Number of children: 1   Years of education: 16   Highest education level: Bachelor's degree (e.g., BA, AB, BS)  Occupational History   Occupation: Information systems manager: OTHER    Comment: [employer]  Tobacco Use   Smoking status: Never   Smokeless tobacco: Never  Vaping Use   Vaping Use: Never used  Substance and Sexual Activity   Alcohol use: Yes    Alcohol/week: 0.0 standard drinks    Comment: rare   Drug use: No   Sexual activity: Yes    Partners: Male    Birth control/protection: OCP  Other Topics Concern   Not on file  Social History Narrative   Not on file   Social Determinants of Health   Financial Resource Strain: Not on file  Food Insecurity: Not on file  Transportation Needs: Not on file  Physical Activity: Not on file  Stress: Not on file  Social Connections: Not on file   Intimate Partner Violence: Not on file   Family Status  Relation Name Status   Mother  Deceased   Father  Alive   Sister  Alive   Mat Uncle  Deceased   Brother  Alive   PGM  (Not Specified)   PGF  (Not Specified)   MGM  (Not Specified)   MGF  (Not Specified)   Neg Hx  (Not Specified)   Family History  Problem Relation Age of Onset   Arthritis Mother    Cancer Mother        Lung   Hypertension Mother    Kidney disease Mother    COPD Mother        smoker   Alzheimer's disease Mother    Arthritis Father    Heart disease Father    COPD Father        smoker   Lung cancer Father    Skin cancer Father    Celiac disease Sister        has been told she is gluten intolerant, not officially diagnosed   Kidney disease Sister    Arthritis Sister    Kidney disease Maternal Uncle 47   Healthy Brother    Heart disease Paternal Grandmother    Hypertension Paternal Grandmother    Heart disease Paternal Grandfather  Hypertension Paternal Grandfather    Stroke Maternal Grandmother    Stroke Maternal Grandfather    Colon cancer Neg Hx    Breast cancer Neg Hx    Ovarian cancer Neg Hx    Cervical cancer Neg Hx    Drug abuse Neg Hx    Allergies  Allergen Reactions   Codeine Itching    Can take cough syrup    Gluten Meal    Morphine And Related Nausea And Vomiting    Patient Care Team: Virginia Crews, MD as PCP - General (Family Medicine)   Medications: Outpatient Medications Prior to Visit  Medication Sig   diphenhydrAMINE (BENADRYL) 25 MG tablet Take 25 mg by mouth at bedtime as needed.   fexofenadine (ALLEGRA ALLERGY) 180 MG tablet Take 1 tablet (180 mg total) by mouth daily.   guaiFENesin (MUCINEX) 600 MG 12 hr tablet Take 1 tablet (600 mg total) by mouth 2 (two) times daily.   Melatonin 10 MG TABS Take 10 mg by mouth at bedtime.   NONFORMULARY OR COMPOUNDED ITEM Estriol 1 mg/gram vaginal cream 1/4 applicator vaginally nightly for two weeks, every other night  for two weeks, then twice a week thereafter   omeprazole (PRILOSEC) 20 MG capsule Take 20 mg by mouth daily.   propranolol ER (INDERAL LA) 60 MG 24 hr capsule Take 1 capsule (60 mg total) by mouth daily.   estradiol-norethindrone (ACTIVELLA) 1-0.5 MG tablet Take by mouth.   [DISCONTINUED] amoxicillin-clavulanate (AUGMENTIN) 875-125 MG tablet Take 1 tablet by mouth every 12 (twelve) hours.   [DISCONTINUED] cyclobenzaprine (FLEXERIL) 5 MG tablet Take 1 tablet by mouth three times daily as needed for muscle spasm   [DISCONTINUED] hydrOXYzine (ATARAX/VISTARIL) 10 MG tablet Take 1 tablet (10 mg total) by mouth 3 (three) times daily as needed for anxiety.   [DISCONTINUED] mupirocin ointment (BACTROBAN) 2 % Apply 1 application topically 2 (two) times daily.   [DISCONTINUED] solifenacin (VESICARE) 5 MG tablet Take by mouth.   No facility-administered medications prior to visit.    Review of Systems  Constitutional:  Positive for fatigue.  HENT:  Positive for rhinorrhea.   Cardiovascular:  Positive for palpitations.  Endocrine: Positive for heat intolerance.  Allergic/Immunologic: Positive for food allergies.  All other systems reviewed and are negative.  Last CBC Lab Results  Component Value Date   WBC 6.1 04/26/2021   HGB 12.5 04/26/2021   HCT 38.0 04/26/2021   MCV 91 04/26/2021   MCH 29.8 04/26/2021   RDW 12.3 04/26/2021   PLT 285 25/95/6387   Last metabolic panel Lab Results  Component Value Date   GLUCOSE 105 (H) 04/26/2021   NA 137 04/26/2021   K 4.4 04/26/2021   CL 99 04/26/2021   CO2 23 04/26/2021   BUN 17 04/26/2021   CREATININE 0.80 04/26/2021   EGFR 86 04/26/2021   CALCIUM 9.3 04/26/2021   PROT 7.1 04/26/2021   ALBUMIN 4.4 04/26/2021   LABGLOB 2.7 04/26/2021   AGRATIO 1.6 04/26/2021   BILITOT <0.2 04/26/2021   ALKPHOS 95 04/26/2021   AST 22 04/26/2021   ALT 17 04/26/2021   Last lipids Lab Results  Component Value Date   CHOL 174 04/26/2021   HDL 44  04/26/2021   LDLCALC 101 (H) 04/26/2021   LDLDIRECT 151.2 09/30/2013   TRIG 168 (H) 04/26/2021   CHOLHDL 4.0 04/26/2021   Last hemoglobin A1c No results found for: HGBA1C Last thyroid functions Lab Results  Component Value Date   TSH 2.540 04/26/2021  T3TOTAL 139 11/13/2015   T4TOTAL 9.0 11/13/2015   Last vitamin D Lab Results  Component Value Date   VD25OH 32.32 11/09/2014   Last vitamin B12 and Folate Lab Results  Component Value Date   VITAMINB12 908 10/14/2019      Objective     BP 105/75 (BP Location: Left Arm, Patient Position: Sitting, Cuff Size: Large)   Pulse 72   Temp 98.3 F (36.8 C) (Oral)   Resp 16   Ht 5' 5"  (1.651 m)   Wt 147 lb 6.4 oz (66.9 kg)   BMI 24.53 kg/m  BP Readings from Last 3 Encounters:  04/29/22 105/75  12/14/21 126/78  07/02/21 122/83   Wt Readings from Last 3 Encounters:  04/29/22 147 lb 6.4 oz (66.9 kg)  12/14/21 150 lb (68 kg)  07/02/21 142 lb 6.4 oz (64.6 kg)   Physical Exam Vitals reviewed.  Constitutional:      General: She is not in acute distress.    Appearance: Normal appearance. She is well-developed. She is not diaphoretic.  HENT:     Head: Normocephalic and atraumatic.     Right Ear: Tympanic membrane, ear canal and external ear normal.     Left Ear: Tympanic membrane, ear canal and external ear normal.     Nose: Nose normal.     Mouth/Throat:     Mouth: Mucous membranes are moist.     Pharynx: Oropharynx is clear. No oropharyngeal exudate.  Eyes:     General: No scleral icterus.    Conjunctiva/sclera: Conjunctivae normal.     Pupils: Pupils are equal, round, and reactive to light.  Neck:     Thyroid: No thyromegaly.  Cardiovascular:     Rate and Rhythm: Normal rate and regular rhythm.     Pulses: Normal pulses.     Heart sounds: Normal heart sounds. No murmur heard. Pulmonary:     Effort: Pulmonary effort is normal. No respiratory distress.     Breath sounds: Normal breath sounds. No wheezing or rales.   Abdominal:     General: There is no distension.     Palpations: Abdomen is soft.     Tenderness: There is no abdominal tenderness.  Musculoskeletal:        General: No deformity.     Cervical back: Neck supple.     Right lower leg: No edema.     Left lower leg: No edema.  Lymphadenopathy:     Cervical: No cervical adenopathy.  Skin:    General: Skin is warm and dry.     Findings: No rash.  Neurological:     Mental Status: She is alert and oriented to person, place, and time. Mental status is at baseline.     Gait: Gait normal.  Psychiatric:        Mood and Affect: Mood normal.        Behavior: Behavior normal.        Thought Content: Thought content normal.       04/29/2022    3:16 PM 07/02/2021    2:51 PM 04/26/2021    1:37 PM  PHQ 2/9 Scores  PHQ - 2 Score 1 0 0  PHQ- 9 Score 2  2   Last fall risk screening    04/29/2022    3:16 PM  [place] in the past year? 0  Number falls in past yr: 0  Injury with Fall? 0  Risk for fall due to : No Fall Risks  Follow up Falls evaluation completed   Last Audit-C alcohol use screening    04/29/2022    3:16 PM  Alcohol Use Disorder Test (AUDIT)  1. How often do you have a drink containing alcohol? 1  2. How many drinks containing alcohol do you have on a typical day when you are drinking? 0  3. How often do you have six or more drinks on one occasion? 0  AUDIT-C Score 1   A score of 3 or more in women, and 4 or more in men indicates increased risk for alcohol abuse, EXCEPT if all of the points are from question 1   No results found for any visits on 04/29/22.  Assessment & Plan    Routine Health Maintenance and Physical Exam  Exercise Activities and Dietary recommendations  Goals   None     Immunization History  Administered Date(s) Administered   Influenza Inj Mdck Quad Pf 12/10/2018   Influenza Split 09/25/2012   Influenza,inj,Quad PF,6+ Mos 09/30/2013, 09/30/2014, 11/13/2015, 11/29/2016, 12/10/2018,  10/14/2019   PFIZER(Purple Top)SARS-COV-2 Vaccination 02/28/2020, 03/21/2020   Tdap 09/25/2012    Health Maintenance  Topic Date Due   Zoster Vaccines- Shingrix (1 of 2) Never done   COVID-19 Vaccine (3 - Pfizer risk series) 04/18/2020   INFLUENZA VACCINE  06/25/2022   TETANUS/TDAP  09/25/2022   MAMMOGRAM  01/05/2023   PAP SMEAR-Modifier  10/02/2023   COLONOSCOPY (Pts 45-94yr Insurance coverage will need to be confirmed)  01/13/2025   Hepatitis C Screening  Completed   HIV Screening  Completed   HPV VACCINES  Aged Out    Discussed health benefits of physical activity, and encouraged her to engage in regular exercise appropriate for her age and condition.  Problem List Items Addressed This Visit   None Visit Diagnoses     Encounter for annual physical exam    -  Primary   Relevant Orders   Comprehensive metabolic panel   Lipid Panel With LDL/HDL Ratio   Hemoglobin A1c   TSH   Encounter for screening mammogram for malignant neoplasm of breast       Relevant Orders   MM 3D SCREEN BREAST BILATERAL   Hyperglycemia       Relevant Orders   Hemoglobin A1c        Return in about 1 year (around 04/30/2023) for CPE.     I, ALavon Paganini MD, have reviewed all documentation for this visit. The documentation on 04/29/22 for the exam, diagnosis, procedures, and orders are all accurate and complete.   Kamylle Axelson, ADionne Bucy MD, MPH [facility]

## 2022-04-29 ENCOUNTER — Encounter: Payer: Self-pay | Admitting: Family Medicine

## 2022-04-29 ENCOUNTER — Ambulatory Visit (INDEPENDENT_AMBULATORY_CARE_PROVIDER_SITE_OTHER): Payer: 59 | Admitting: Family Medicine

## 2022-04-29 VITALS — BP 105/75 | HR 72 | Temp 98.3°F | Resp 16 | Ht 65.0 in | Wt 147.4 lb

## 2022-04-29 DIAGNOSIS — Z0000 Encounter for general adult medical examination without abnormal findings: Secondary | ICD-10-CM

## 2022-04-29 DIAGNOSIS — R739 Hyperglycemia, unspecified: Secondary | ICD-10-CM | POA: Diagnosis not present

## 2022-04-29 DIAGNOSIS — Z1231 Encounter for screening mammogram for malignant neoplasm of breast: Secondary | ICD-10-CM

## 2022-05-03 DIAGNOSIS — R739 Hyperglycemia, unspecified: Secondary | ICD-10-CM | POA: Diagnosis not present

## 2022-05-03 DIAGNOSIS — Z0000 Encounter for general adult medical examination without abnormal findings: Secondary | ICD-10-CM | POA: Diagnosis not present

## 2022-05-04 LAB — COMPREHENSIVE METABOLIC PANEL
ALT: 18 IU/L (ref 0–32)
AST: 24 IU/L (ref 0–40)
Albumin/Globulin Ratio: 1.7 (ref 1.2–2.2)
Albumin: 4.5 g/dL (ref 3.8–4.9)
Alkaline Phosphatase: 80 IU/L (ref 44–121)
BUN/Creatinine Ratio: 17 (ref 9–23)
BUN: 16 mg/dL (ref 6–24)
Bilirubin Total: 0.3 mg/dL (ref 0.0–1.2)
CO2: 24 mmol/L (ref 20–29)
Calcium: 9.8 mg/dL (ref 8.7–10.2)
Chloride: 101 mmol/L (ref 96–106)
Creatinine, Ser: 0.93 mg/dL (ref 0.57–1.00)
Globulin, Total: 2.6 g/dL (ref 1.5–4.5)
Glucose: 92 mg/dL (ref 70–99)
Potassium: 4.7 mmol/L (ref 3.5–5.2)
Sodium: 138 mmol/L (ref 134–144)
Total Protein: 7.1 g/dL (ref 6.0–8.5)
eGFR: 72 mL/min/{1.73_m2} (ref >59)

## 2022-05-04 LAB — LIPID PANEL WITH LDL/HDL RATIO
Cholesterol, Total: 188 mg/dL (ref 100–199)
HDL: 45 mg/dL (ref >39)
LDL Chol Calc (NIH): 119 mg/dL — ABNORMAL HIGH (ref 0–99)
LDL/HDL Ratio: 2.6 ratio (ref 0.0–3.2)
Triglycerides: 134 mg/dL (ref 0–149)
VLDL Cholesterol Cal: 24 mg/dL (ref 5–40)

## 2022-05-04 LAB — HEMOGLOBIN A1C
Est. average glucose Bld gHb Est-mCnc: 120 mg/dL
Hgb A1c MFr Bld: 5.8 % — ABNORMAL HIGH (ref 4.8–5.6)

## 2022-05-04 LAB — TSH: TSH: 2.85 u[IU]/mL (ref 0.450–4.500)

## 2022-05-06 ENCOUNTER — Telehealth: Payer: Self-pay

## 2022-05-06 DIAGNOSIS — R0609 Other forms of dyspnea: Secondary | ICD-10-CM | POA: Diagnosis not present

## 2022-05-06 DIAGNOSIS — I491 Atrial premature depolarization: Secondary | ICD-10-CM | POA: Diagnosis not present

## 2022-05-06 DIAGNOSIS — R002 Palpitations: Secondary | ICD-10-CM | POA: Diagnosis not present

## 2022-05-06 NOTE — Telephone Encounter (Signed)
Copied from CRM [id number]. Topic: General - Other >> May 03, 2022  5:01 PM Dondra Prader E wrote: Reason for CRM: Pt called to report that she believes she left her jacket in the office. Its black and has [employer] embroidered on it.  Best contact: [phone]

## 2022-05-06 NOTE — Telephone Encounter (Signed)
Called patient to let her know that it is not here.

## 2022-05-08 DIAGNOSIS — Z1331 Encounter for screening for depression: Secondary | ICD-10-CM | POA: Diagnosis not present

## 2022-05-08 DIAGNOSIS — Z1231 Encounter for screening mammogram for malignant neoplasm of breast: Secondary | ICD-10-CM | POA: Diagnosis not present

## 2022-05-08 DIAGNOSIS — Z01419 Encounter for gynecological examination (general) (routine) without abnormal findings: Secondary | ICD-10-CM | POA: Diagnosis not present

## 2022-05-16 ENCOUNTER — Ambulatory Visit
Admission: RE | Admit: 2022-05-16 | Discharge: 2022-05-16 | Disposition: A | Discharge status: Home / Self Care | Payer: 59 | Source: Ambulatory Visit | Attending: Family Medicine | Admitting: Family Medicine

## 2022-05-16 DIAGNOSIS — Z1231 Encounter for screening mammogram for malignant neoplasm of breast: Secondary | ICD-10-CM | POA: Diagnosis not present

## 2022-06-03 DIAGNOSIS — R002 Palpitations: Secondary | ICD-10-CM | POA: Diagnosis not present

## 2022-06-03 DIAGNOSIS — R0609 Other forms of dyspnea: Secondary | ICD-10-CM | POA: Diagnosis not present

## 2022-06-12 DIAGNOSIS — H04123 Dry eye syndrome of bilateral lacrimal glands: Secondary | ICD-10-CM | POA: Diagnosis not present

## 2022-06-18 DIAGNOSIS — R002 Palpitations: Secondary | ICD-10-CM | POA: Diagnosis not present

## 2022-06-18 DIAGNOSIS — I491 Atrial premature depolarization: Secondary | ICD-10-CM | POA: Diagnosis not present

## 2022-09-24 DIAGNOSIS — D2261 Melanocytic nevi of right upper limb, including shoulder: Secondary | ICD-10-CM | POA: Diagnosis not present

## 2022-09-24 DIAGNOSIS — D2272 Melanocytic nevi of left lower limb, including hip: Secondary | ICD-10-CM | POA: Diagnosis not present

## 2022-09-24 DIAGNOSIS — L814 Other melanin hyperpigmentation: Secondary | ICD-10-CM | POA: Diagnosis not present

## 2022-09-24 DIAGNOSIS — D225 Melanocytic nevi of trunk: Secondary | ICD-10-CM | POA: Diagnosis not present

## 2022-09-24 DIAGNOSIS — D2262 Melanocytic nevi of left upper limb, including shoulder: Secondary | ICD-10-CM | POA: Diagnosis not present

## 2022-09-24 DIAGNOSIS — D2271 Melanocytic nevi of right lower limb, including hip: Secondary | ICD-10-CM | POA: Diagnosis not present

## 2022-09-24 DIAGNOSIS — L578 Other skin changes due to chronic exposure to nonionizing radiation: Secondary | ICD-10-CM | POA: Diagnosis not present

## 2022-09-24 DIAGNOSIS — L821 Other seborrheic keratosis: Secondary | ICD-10-CM | POA: Diagnosis not present

## 2022-11-07 ENCOUNTER — Ambulatory Visit: Payer: 59 | Admitting: Family Medicine

## 2022-11-20 DIAGNOSIS — Z6821 Body mass index (BMI) 21.0-21.9, adult: Secondary | ICD-10-CM | POA: Diagnosis not present

## 2022-11-20 DIAGNOSIS — J0140 Acute pansinusitis, unspecified: Secondary | ICD-10-CM | POA: Diagnosis not present

## 2022-12-17 DIAGNOSIS — I491 Atrial premature depolarization: Secondary | ICD-10-CM | POA: Diagnosis not present

## 2022-12-17 DIAGNOSIS — R002 Palpitations: Secondary | ICD-10-CM | POA: Diagnosis not present

## 2023-02-03 DIAGNOSIS — L309 Dermatitis, unspecified: Secondary | ICD-10-CM | POA: Diagnosis not present

## 2023-02-17 DIAGNOSIS — L111 Transient acantholytic dermatosis [Grover]: Secondary | ICD-10-CM | POA: Diagnosis not present

## 2023-02-18 ENCOUNTER — Encounter: Payer: Self-pay | Admitting: Physician Assistant

## 2023-02-18 ENCOUNTER — Ambulatory Visit: Payer: 59 | Admitting: Physician Assistant

## 2023-02-18 VITALS — BP 108/63 | HR 70 | Ht 65.0 in | Wt 135.8 lb

## 2023-02-18 DIAGNOSIS — H8111 Benign paroxysmal vertigo, right ear: Secondary | ICD-10-CM

## 2023-02-18 DIAGNOSIS — R079 Chest pain, unspecified: Secondary | ICD-10-CM

## 2023-02-18 MED ORDER — MECLIZINE HCL 25 MG PO TABS
25.0000 mg | ORAL_TABLET | Freq: Three times a day (TID) | ORAL | 0 refills | Status: DC | PRN
Start: 2023-02-18 — End: 2023-02-28

## 2023-02-18 MED ORDER — ONDANSETRON HCL 4 MG PO TABS: 4.0000 mg | ORAL_TABLET | Freq: Three times a day (TID) | ORAL | 0 refills | Status: DC | PRN

## 2023-02-18 NOTE — Progress Notes (Signed)
Established patient visit   Patient: Leslie Ruiz   DOB: 1966/08/11   57 y.o. Female  MRN: [id number] Visit Date: 02/18/2023  Today's healthcare provider: Mikey Kirschner, PA-C   Cc. Dizziness, headache x 6 days  Subjective     Pt reports dizziness/lightheadedness/room spinning for the last 6 days. Reports all movement makes it worse-- turning head, bending over, looking back. At worst it will make her feel nauseous. She reports jolts of pain in her head sometimes.  Reports history of lightheadness, but never this severe, always resolved in 1-2 days. She tried allergy medicine OTC with no improvement.   Reports ongoing heart palpitations and the other day a discomfort or pressure in her chest. Denies diaphoresis, chest pain, weakness, numbness.   Medications: Outpatient Medications Prior to Visit  Medication Sig   diphenhydrAMINE (BENADRYL) 25 MG tablet Take 25 mg by mouth at bedtime as needed.   NONFORMULARY OR COMPOUNDED ITEM Estriol 1 mg/gram vaginal cream 1/4 applicator vaginally nightly for two weeks, every other night for two weeks, then twice a week thereafter   omeprazole (PRILOSEC) 20 MG capsule Take 20 mg by mouth daily.   propranolol ER (INDERAL LA) 60 MG 24 hr capsule Take 1 capsule (60 mg total) by mouth daily.   estradiol-norethindrone (ACTIVELLA) 1-0.5 MG tablet Take by mouth.   fexofenadine (ALLEGRA ALLERGY) 180 MG tablet Take 1 tablet (180 mg total) by mouth daily. (Patient not taking: Reported on 02/18/2023)   guaiFENesin (MUCINEX) 600 MG 12 hr tablet Take 1 tablet (600 mg total) by mouth 2 (two) times daily. (Patient not taking: Reported on 02/18/2023)   Melatonin 10 MG TABS Take 10 mg by mouth at bedtime. (Patient not taking: Reported on 02/18/2023)   No facility-administered medications prior to visit.    Review of Systems  Neurological:  Positive for dizziness and headaches.      Objective    BP 108/63   Pulse 70   Ht 5\' 5"  (1.651 m)   Wt 135  lb 12.8 oz (61.6 kg)   SpO2 100%   BMI 22.60 kg/m    Physical Exam Constitutional:      General: She is awake.     Appearance: She is well-developed.  HENT:     Head: Normocephalic.     Right Ear: Tympanic membrane normal.     Left Ear: Tympanic membrane normal.  Eyes:     Extraocular Movements: Extraocular movements intact.     Conjunctiva/sclera: Conjunctivae normal.     Pupils: Pupils are equal, round, and reactive to light.  Cardiovascular:     Rate and Rhythm: Normal rate and regular rhythm.     Heart sounds: Normal heart sounds.  Pulmonary:     Effort: Pulmonary effort is normal.     Breath sounds: Normal breath sounds. No wheezing, rhonchi or rales.  Skin:    General: Skin is warm.  Neurological:     General: No focal deficit present.     Mental Status: She is alert and oriented to person, place, and time.     Motor: No weakness.  Psychiatric:        Attention and Perception: Attention normal.        Mood and Affect: Mood normal.        Speech: Speech normal.        Behavior: Behavior is cooperative.     No results found for any visits on 02/18/23.  Assessment & Plan  BPPV + dizziness with epley maneuver. Rx meclizine, taught epley maneuver to pt. Rx zofran for nausea If no improvement return to office  2. Chest pain EKG NSR today with no st or t wave changes. She follows with cardiology for her palpitations    Return if symptoms worsen or fail to improve.      I, Mikey Kirschner, PA-C have reviewed all documentation for this visit. The documentation on 02/18/23  for the exam, diagnosis, procedures, and orders are all accurate and complete.  Mikey Kirschner, PA-C [facility] [address] [place], [place], [address] Office: [phone] Fax: [place]

## 2023-02-27 ENCOUNTER — Other Ambulatory Visit: Payer: Self-pay | Admitting: Physician Assistant

## 2023-02-27 ENCOUNTER — Telehealth: Payer: Self-pay | Admitting: Family Medicine

## 2023-02-27 DIAGNOSIS — H8111 Benign paroxysmal vertigo, right ear: Secondary | ICD-10-CM

## 2023-02-27 NOTE — Telephone Encounter (Signed)
Pt is calling in because she was seen in office by Mikey Kirschner on Monday for dizziness. Pt says while the dizziness has improved, it's still there. Pt says she has been doing the instructed exercises and she still feels the dizziness. Pt would like to know if there is any medication she can take or should she come in to be seen again? Please advise.

## 2023-02-28 ENCOUNTER — Other Ambulatory Visit: Payer: Self-pay | Admitting: Physician Assistant

## 2023-02-28 DIAGNOSIS — H8111 Benign paroxysmal vertigo, right ear: Secondary | ICD-10-CM

## 2023-02-28 MED ORDER — MECLIZINE HCL 25 MG PO TABS: 25.0000 mg | ORAL_TABLET | Freq: Three times a day (TID) | ORAL | 0 refills | Status: DC | PRN

## 2023-02-28 NOTE — Telephone Encounter (Signed)
Patient advised. Verbalized understanding

## 2023-03-19 DIAGNOSIS — H8110 Benign paroxysmal vertigo, unspecified ear: Secondary | ICD-10-CM | POA: Diagnosis not present

## 2023-03-19 DIAGNOSIS — H93293 Other abnormal auditory perceptions, bilateral: Secondary | ICD-10-CM | POA: Diagnosis not present

## 2023-05-01 ENCOUNTER — Encounter: Payer: Self-pay | Admitting: Family Medicine

## 2023-05-01 ENCOUNTER — Ambulatory Visit (INDEPENDENT_AMBULATORY_CARE_PROVIDER_SITE_OTHER): Payer: 59 | Admitting: Family Medicine

## 2023-05-01 VITALS — BP 92/66 | HR 73 | Temp 98.3°F | Resp 14 | Ht 65.0 in | Wt 137.7 lb

## 2023-05-01 DIAGNOSIS — L111 Transient acantholytic dermatosis [Grover]: Secondary | ICD-10-CM | POA: Diagnosis not present

## 2023-05-01 DIAGNOSIS — B353 Tinea pedis: Secondary | ICD-10-CM

## 2023-05-01 DIAGNOSIS — Z0000 Encounter for general adult medical examination without abnormal findings: Secondary | ICD-10-CM

## 2023-05-01 DIAGNOSIS — Z1231 Encounter for screening mammogram for malignant neoplasm of breast: Secondary | ICD-10-CM | POA: Diagnosis not present

## 2023-05-01 DIAGNOSIS — R7303 Prediabetes: Secondary | ICD-10-CM

## 2023-05-01 MED ORDER — CLOTRIMAZOLE-BETAMETHASONE 1-0.05 % EX CREA: 1.0000 | TOPICAL_CREAM | Freq: Every day | CUTANEOUS | 1 refills | Status: DC

## 2023-05-01 NOTE — Assessment & Plan Note (Signed)
Recommend low carb diet °Recheck A1c  °

## 2023-05-01 NOTE — Patient Instructions (Signed)
Dr Langston Reusing

## 2023-05-01 NOTE — Progress Notes (Signed)
Complete physical exam   Patient: Leslie Ruiz   DOB: 1966-01-09   57 y.o. Female  MRN: [id number] Visit Date: 05/01/2023  Today's healthcare provider: Shirlee Latch, MD   Chief Complaint  Patient presents with   Annual Exam   Subjective    ASHLA PULLIS is a 57 y.o. female who presents today for a complete physical exam.  She reports consuming a general diet.  She generally feels well. She reports sleeping well. She does have additional problems to discuss today.  HPI   Discussed the use of AI scribe software for clinical note transcription with the patient, who gave verbal consent to proceed.  History of Present Illness   The patient, with a history of HPV and Grover's disease, presents for a physical examination. The patient's primary concern is a recurring athlete's foot, affecting two spots on the left foot and one on the right. The patient has been self-treating with over-the-counter medications for several years, but the condition has not resolved. The patient also reports a persistent issue with throat clearing, which is causing embarrassment during client interactions. The patient is currently on hormone replacement therapy and has been managing the throat clearing issue with over-the-counter allergy medications and omeprazole. The patient also mentions a past episode of dizziness and vertigo, which was evaluated by an ENT specialist and resolved with exercises. The patient occasionally experiences brief episodes of dizziness, but these are infrequent and resolve quickly.       Past Medical History:  Diagnosis Date   Dupuytren's contracture of left hand    Gluten intolerance    Past Surgical History:  Procedure Laterality Date   CESAREAN SECTION     Social History   Socioeconomic History   Marital status: Married    Spouse name: Rocky Link   Number of children: 1   Years of education: 16   Highest education level: Bachelor's degree (e.g., BA, AB, BS)   Occupational History   Occupation: Heritage manager: OTHER    Comment: [employer]  Tobacco Use   Smoking status: Never   Smokeless tobacco: Never  Vaping Use   Vaping Use: Never used  Substance and Sexual Activity   Alcohol use: Yes    Alcohol/week: 0.0 standard drinks of alcohol    Comment: rare   Drug use: No   Sexual activity: Yes    Partners: Male    Birth control/protection: OCP  Other Topics Concern   Not on file  Social History Narrative   Not on file   Social Determinants of Health   Financial Resource Strain: Low Risk  (01/20/2018)   Overall Financial Resource Strain (CARDIA)    Difficulty of Paying Living Expenses: Not hard at all  Food Insecurity: No Food Insecurity (01/20/2018)   Hunger Vital Sign    Worried About Running Out of Food in the Last Year: Never true    Ran Out of Food in the Last Year: Never true  Transportation Needs: No Transportation Needs (01/20/2018)   PRAPARE - Administrator, Civil Service (Medical): No    Lack of Transportation (Non-Medical): No  Physical Activity: Inactive (01/20/2018)   Exercise Vital Sign    Days of Exercise per Week: 0 days    Minutes of Exercise per Session: 0 min  Stress: Not on file  Social Connections: Not on file  Intimate Partner Violence: Not on file   Family Status  Relation Name Status  Mother  Deceased   Father  Alive   Sister  Alive   Mat Uncle  Deceased   Brother  Alive   PGM  (Not Specified)   PGF  (Not Specified)   MGM  (Not Specified)   MGF  (Not Specified)   Neg Hx  (Not Specified)   Family History  Problem Relation Age of Onset   Arthritis Mother    Cancer Mother        Lung   Hypertension Mother    Kidney disease Mother    COPD Mother        smoker   Alzheimer's disease Mother    Arthritis Father    Heart disease Father    COPD Father        smoker   Lung cancer Father    Skin cancer Father    Celiac disease Sister        has been told she is gluten  intolerant, not officially diagnosed   Kidney disease Sister    Arthritis Sister    Kidney disease Maternal Uncle 30   Healthy Brother    Heart disease Paternal Grandmother    Hypertension Paternal Grandmother    Heart disease Paternal Grandfather    Hypertension Paternal Grandfather    Stroke Maternal Grandmother    Stroke Maternal Grandfather    Colon cancer Neg Hx    Breast cancer Neg Hx    Ovarian cancer Neg Hx    Cervical cancer Neg Hx    Drug abuse Neg Hx    Allergies  Allergen Reactions   Codeine Itching    Can take cough syrup    Gluten Meal    Morphine And Codeine Nausea And Vomiting    Patient Care Team: Erasmo Downer, MD as PCP - General (Family Medicine)   Medications: Outpatient Medications Prior to Visit  Medication Sig   diphenhydrAMINE (BENADRYL) 25 MG tablet Take 25 mg by mouth at bedtime as needed.   fexofenadine (ALLEGRA ALLERGY) 180 MG tablet Take 1 tablet (180 mg total) by mouth daily.   guaiFENesin (MUCINEX) 600 MG 12 hr tablet Take 1 tablet (600 mg total) by mouth 2 (two) times daily. (Patient taking differently: Take 600 mg by mouth as needed.)   Melatonin 10 MG TABS Take 10 mg by mouth at bedtime.   omeprazole (PRILOSEC) 20 MG capsule Take 20 mg by mouth daily.   propranolol ER (INDERAL LA) 60 MG 24 hr capsule Take 1 capsule (60 mg total) by mouth daily.   estradiol-norethindrone (ACTIVELLA) 1-0.5 MG tablet Take by mouth.   NONFORMULARY OR COMPOUNDED ITEM Estriol 1 mg/gram vaginal cream 1/4 applicator vaginally nightly for two weeks, every other night for two weeks, then twice a week thereafter   [DISCONTINUED] meclizine (ANTIVERT) 25 MG tablet Take 1 tablet (25 mg total) by mouth 3 (three) times daily as needed for dizziness. (Patient not taking: Reported on 05/01/2023)   [DISCONTINUED] ondansetron (ZOFRAN) 4 MG tablet Take 1 tablet (4 mg total) by mouth every 8 (eight) hours as needed for nausea or vomiting. (Patient not taking: Reported on  05/01/2023)   No facility-administered medications prior to visit.    Review of Systems  HENT:  Positive for postnasal drip.   Cardiovascular:  Positive for palpitations.  Allergic/Immunologic: Positive for food allergies.      Objective    BP 92/66 (BP Location: Right Arm, Patient Position: Sitting, Cuff Size: Normal)   Pulse 73   Temp 98.3 F (36.8  C) (Oral)   Resp 14   Ht 5\' 5"  (1.651 m)   Wt 137 lb 11.2 oz (62.5 kg)   SpO2 100%   BMI 22.91 kg/m     Physical Exam Vitals reviewed.  Constitutional:      General: She is not in acute distress.    Appearance: Normal appearance. She is well-developed. She is not diaphoretic.  HENT:     Head: Normocephalic and atraumatic.     Right Ear: Tympanic membrane, ear canal and external ear normal.     Left Ear: Tympanic membrane, ear canal and external ear normal.     Nose: Nose normal.     Mouth/Throat:     Mouth: Mucous membranes are moist.     Pharynx: Oropharynx is clear. No oropharyngeal exudate.  Eyes:     General: No scleral icterus.    Conjunctiva/sclera: Conjunctivae normal.     Pupils: Pupils are equal, round, and reactive to light.  Neck:     Thyroid: No thyromegaly.  Cardiovascular:     Rate and Rhythm: Normal rate and regular rhythm.     Pulses: Normal pulses.     Heart sounds: Normal heart sounds. No murmur heard. Pulmonary:     Effort: Pulmonary effort is normal. No respiratory distress.     Breath sounds: Normal breath sounds. No wheezing or rales.  Abdominal:     General: There is no distension.     Palpations: Abdomen is soft.     Tenderness: There is no abdominal tenderness.  Musculoskeletal:        General: No deformity.     Cervical back: Neck supple.     Right lower leg: No edema.     Left lower leg: No edema.  Lymphadenopathy:     Cervical: No cervical adenopathy.  Skin:    General: Skin is warm and dry.     Findings: No rash.  Neurological:     Mental Status: She is alert and oriented to  person, place, and time. Mental status is at baseline.     Gait: Gait normal.  Psychiatric:        Mood and Affect: Mood normal.        Behavior: Behavior normal.        Thought Content: Thought content normal.     Physical Exam   SKIN: Crusted lesion between fourth and fifth toe on dorsum of left foot.       Last depression screening scores    05/01/2023    9:52 AM 02/18/2023   11:50 AM 04/29/2022    3:16 PM  PHQ 2/9 Scores  PHQ - 2 Score 0 1 1  PHQ- 9 Score 0 1 2   Last fall risk screening    05/01/2023    9:52 AM  Fall Risk   Falls in the past year? 0  Number falls in past yr: 0  Injury with Fall? 0  Risk for fall due to : No Fall Risks  Follow up Falls evaluation completed   Last Audit-C alcohol use screening    05/01/2023    9:52 AM  Alcohol Use Disorder Test (AUDIT)  1. How often do you have a drink containing alcohol? 1  2. How many drinks containing alcohol do you have on a typical day when you are drinking? 0  3. How often do you have six or more drinks on one occasion? 0  AUDIT-C Score 1   A score of 3 or more in  women, and 4 or more in men indicates increased risk for alcohol abuse, EXCEPT if all of the points are from question 1   No results found for any visits on 05/01/23.  Assessment & Plan    Routine Health Maintenance and Physical Exam  Exercise Activities and Dietary recommendations  Goals   None     Immunization History  Administered Date(s) Administered   Influenza Inj Mdck Quad Pf 12/10/2018   Influenza Split 09/25/2012   Influenza,inj,Quad PF,6+ Mos 09/30/2013, 09/30/2014, 11/13/2015, 11/29/2016, 12/10/2018, 10/14/2019   PFIZER(Purple Top)SARS-COV-2 Vaccination 02/28/2020, 03/21/2020   Tdap 09/25/2012    Health Maintenance  Topic Date Due   Zoster Vaccines- Shingrix (1 of 2) Never done   COVID-19 Vaccine (3 - Pfizer risk series) 04/18/2020   DTaP/Tdap/Td (2 - Td or Tdap) 09/25/2022   INFLUENZA VACCINE  06/26/2023   PAP  SMEAR-Modifier  10/02/2023   MAMMOGRAM  05/16/2024   Colonoscopy  01/13/2025   Hepatitis C Screening  Completed   HIV Screening  Completed   HPV VACCINES  Aged Out    Discussed health benefits of physical activity, and encouraged her to engage in regular exercise appropriate for her age and condition.  Problem List Items Addressed This Visit       Other   Prediabetes    Recommend low carb diet Recheck A1c       Relevant Orders   Hemoglobin A1c   Other Visit Diagnoses     Encounter for annual physical exam    -  Primary   Relevant Orders   Hemoglobin A1c   Comprehensive metabolic panel   Lipid panel   Breast cancer screening by mammogram       Relevant Orders   MM 3D SCREENING MAMMOGRAM BILATERAL BREAST   Tinea pedis of both feet       Relevant Medications   clotrimazole-betamethasone (LOTRISONE) cream   Grover's disease       Relevant Orders   Ambulatory referral to Dermatology           Tinea Pedis (Athlete's Foot): Persistent despite over-the-counter treatments. No other skin abnormalities noted. -Prescribe Clotrimazole-Betamethasone (Lotrisone) cream, apply once daily.  Vertigo: Resolved with Epley maneuver. Occasional brief episodes. -Continue Epley maneuver as needed.  General Health Maintenance: -Order mammogram due later this month (last performed May 16, 2022). -Pap smear due in November 2024 (last performed November 2019). Patient to continue follow-up with Dr. Dalbert Garnet. -Due for Tetanus vaccine. -Discussed Shingles vaccine. Patient to check with insurance for coverage and consider getting vaccine at [facility]. -Recommend follow-up with ENT for persistent throat clearing despite current treatment with Omeprazole and allergy medications. -Refer to Dr. Langston Reusing, Dermatologist, for management of Grover's disease and potential Dupixent prescription. -Reschedule fasting labs for cholesterol check. Patient to pick up lab slip and schedule at her convenience.         Return in about 1 year (around 04/30/2024) for CPE.     I, Shirlee Latch, MD, have reviewed all documentation for this visit. The documentation on 05/01/23 for the exam, diagnosis, procedures, and orders are all accurate and complete.   Daelyn Mozer, Marzella Schlein, MD, MPH [facility]

## 2023-05-05 DIAGNOSIS — R7303 Prediabetes: Secondary | ICD-10-CM | POA: Diagnosis not present

## 2023-05-05 DIAGNOSIS — Z0000 Encounter for general adult medical examination without abnormal findings: Secondary | ICD-10-CM | POA: Diagnosis not present

## 2023-05-06 LAB — LIPID PANEL
Chol/HDL Ratio: 3.3 ratio (ref 0.0–4.4)
Cholesterol, Total: 178 mg/dL (ref 100–199)
HDL: 54 mg/dL (ref >39)
LDL Chol Calc (NIH): 107 mg/dL — ABNORMAL HIGH (ref 0–99)
Triglycerides: 94 mg/dL (ref 0–149)
VLDL Cholesterol Cal: 17 mg/dL (ref 5–40)

## 2023-05-06 LAB — COMPREHENSIVE METABOLIC PANEL
ALT: 13 IU/L (ref 0–32)
AST: 21 IU/L (ref 0–40)
Albumin/Globulin Ratio: 1.6
Albumin: 4.2 g/dL (ref 3.8–4.9)
Alkaline Phosphatase: 92 IU/L (ref 44–121)
BUN/Creatinine Ratio: 15 (ref 9–23)
BUN: 15 mg/dL (ref 6–24)
Bilirubin Total: 0.4 mg/dL (ref 0.0–1.2)
CO2: 23 mmol/L (ref 20–29)
Calcium: 9.6 mg/dL (ref 8.7–10.2)
Chloride: 103 mmol/L (ref 96–106)
Creatinine, Ser: 0.98 mg/dL (ref 0.57–1.00)
Globulin, Total: 2.7 g/dL (ref 1.5–4.5)
Glucose: 83 mg/dL (ref 70–99)
Potassium: 4.6 mmol/L (ref 3.5–5.2)
Sodium: 140 mmol/L (ref 134–144)
Total Protein: 6.9 g/dL (ref 6.0–8.5)
eGFR: 67 mL/min/{1.73_m2} (ref >59)

## 2023-05-06 LAB — HEMOGLOBIN A1C
Est. average glucose Bld gHb Est-mCnc: 123 mg/dL
Hgb A1c MFr Bld: 5.9 % — ABNORMAL HIGH (ref 4.8–5.6)

## 2023-05-14 DIAGNOSIS — Z01419 Encounter for gynecological examination (general) (routine) without abnormal findings: Secondary | ICD-10-CM | POA: Diagnosis not present

## 2023-05-14 DIAGNOSIS — Z1231 Encounter for screening mammogram for malignant neoplasm of breast: Secondary | ICD-10-CM | POA: Diagnosis not present

## 2023-05-14 DIAGNOSIS — Z1331 Encounter for screening for depression: Secondary | ICD-10-CM | POA: Diagnosis not present

## 2023-06-02 ENCOUNTER — Telehealth: Payer: Self-pay | Admitting: Family Medicine

## 2023-06-02 NOTE — Telephone Encounter (Signed)
Pt is calling to report that [facility]  not in network with her insurance. Pt is requesting referral to be placed at [facility] [address] Juliane Lack [phone]

## 2023-06-02 NOTE — Telephone Encounter (Signed)
Referral coordinator advised.  

## 2023-06-09 DIAGNOSIS — L111 Transient acantholytic dermatosis [Grover]: Secondary | ICD-10-CM | POA: Diagnosis not present

## 2023-06-09 DIAGNOSIS — L309 Dermatitis, unspecified: Secondary | ICD-10-CM | POA: Diagnosis not present

## 2023-06-11 ENCOUNTER — Ambulatory Visit
Admission: RE | Admit: 2023-06-11 | Discharge: 2023-06-11 | Disposition: A | Discharge status: Home / Self Care | Payer: 59 | Source: Ambulatory Visit | Attending: Family Medicine | Admitting: Family Medicine

## 2023-06-11 DIAGNOSIS — Z1231 Encounter for screening mammogram for malignant neoplasm of breast: Secondary | ICD-10-CM | POA: Insufficient documentation

## 2023-09-19 ENCOUNTER — Ambulatory Visit: Payer: Self-pay

## 2023-09-19 NOTE — Telephone Encounter (Signed)
Chief Complaint: Dizziness  Symptoms: Moderate dizziness, headache, nausea Frequency: comes and goes  Pertinent Negatives: Patient denies vomiting, falls, change in vision Disposition: [] ED /[] Urgent Care (no appt availability in office) / [x] Appointment(In office/virtual)/ []  [facility]/ [] Home Care/ [] Refused Recommended Disposition /[] [facility]/ []  Follow-up with PCP Additional Notes: Patient states she has been feeling dizzy since last Saturday. It started with a headache and than she began to feel dizzy and nausea. Patient reports she had these symptoms before in the past and was told by ENT to try the epley maneuver but was not diagnosed with anything that was causing the dizziness. Patient stated in the past the epley maneuver was helpful but it is not this time around. Patient stated she has tried doing the maneuver twice since the dizziness started without much improvement. Care advice was given and patient has been scheduled for a visit with PCP on 09/23/23. Patient requesting additional recommendations from PCP on things she can try to decrease the dizziness and wants to know how ofter should she be doing the epley maneuver. Advised I would forward to PCP for additional recommendations.    Summary: Dizziness/vertigo Advice   Pt is calling to report that she is dizzy. Has completed the epley maneuver and has already seen an ENT. Everything has checked out.     Reason for Disposition  [1] MODERATE dizziness (e.g., interferes with normal activities) AND [2] has been evaluated by doctor (or NP/PA) for this  Answer Assessment - Initial Assessment Questions 1. DESCRIPTION: "Describe your dizziness."     Severe dizziness  2. LIGHTHEADED: "Do you feel lightheaded?" (e.g., somewhat faint, woozy, weak upon standing)     Yes 3. VERTIGO: "Do you feel like either you or the room is spinning or tilting?" (i.e. vertigo)     Yes  4. SEVERITY: "How bad is it?"  "Do you feel  like you are going to faint?" "Can you stand and walk?"   - MILD: Feels slightly dizzy, but walking normally.   - MODERATE: Feels unsteady when walking, but not falling; interferes with normal activities (e.g., school, work).   - SEVERE: Unable to walk without falling, or requires assistance to walk without falling; feels like passing out now.      Mild to Moderate  5. ONSET:  "When did the dizziness begin?"     Saturday 6. AGGRAVATING FACTORS: "Does anything make it worse?" (e.g., standing, change in head position)     Changing in head position, mornings are worse  8. CAUSE: "What do you think is causing the dizziness?"     I'm not sure the ENT wasn't able to tell me  9. RECURRENT SYMPTOM: "Have you had dizziness before?" If Yes, ask: "When was the last time?" "What happened that time?"     Yes, earlier this year  10. OTHER SYMPTOMS: "Do you have any other symptoms?" (e.g., fever, chest pain, vomiting, diarrhea, bleeding)       Headache, nausea  Protocols used: Dizziness - Lightheadedness-A-AH

## 2023-09-19 NOTE — Telephone Encounter (Signed)
Please review for Dr. B 

## 2023-09-23 ENCOUNTER — Ambulatory Visit: Payer: 59 | Admitting: Family Medicine

## 2023-09-23 ENCOUNTER — Encounter: Payer: Self-pay | Admitting: Family Medicine

## 2023-09-23 VITALS — BP 109/74 | HR 78 | Ht 65.0 in | Wt 141.0 lb

## 2023-09-23 DIAGNOSIS — H8110 Benign paroxysmal vertigo, unspecified ear: Secondary | ICD-10-CM

## 2023-09-23 DIAGNOSIS — Z23 Encounter for immunization: Secondary | ICD-10-CM | POA: Diagnosis not present

## 2023-09-23 MED ORDER — MECLIZINE HCL 25 MG PO TABS: 25.0000 mg | ORAL_TABLET | Freq: Three times a day (TID) | ORAL | 0 refills | Status: AC | PRN

## 2023-09-23 NOTE — Progress Notes (Signed)
   Acute Office Visit  Subjective:     Patient ID: Leslie Ruiz, female    DOB: February 13, 1966, 57 y.o.   MRN: [id number]  Chief Complaint  Patient presents with   Dizziness    Pt stated--dizziness is not going away--worse when getting, looking down, turning the head--11 days.    HPI Discussed the use of AI scribe software for clinical note transcription with the patient, who gave verbal consent to proceed.  History of Present Illness   The patient, with a history of Grover's disease and recurrent dizziness, presents with worsening dizziness and lightheadedness. The symptoms began over a week ago, following a period of being symptom-free since April. The patient describes feeling "horrible" by Saturday night, with near falls in the bathroom. The patient attempted the Epley maneuver, which provided temporary relief, but symptoms worsened again after a few days. The patient reports that symptoms are worse with movement, particularly looking up or down, and are manageable when staring straight ahead. The patient also reports a history of Grover's disease, which was severe and required treatment with Dupixent. The patient expresses concern about a potential flare-up of Grover's disease if they receive the COVID-19 vaccine.       ROS      Objective:    BP 109/74   Pulse 78   Ht 5\' 5"  (1.651 m)   Wt 141 lb (64 kg)   SpO2 99%   BMI 23.46 kg/m    Physical Exam Constitutional:      General: She is not in acute distress.    Appearance: Normal appearance.  HENT:     Head: Normocephalic.  Pulmonary:     Effort: Pulmonary effort is normal. No respiratory distress.  Neurological:     Mental Status: She is alert and oriented to person, place, and time. Mental status is at baseline.     No results found for any visits on 09/23/23.      Assessment & Plan:   Problem List Items Addressed This Visit   None Visit Diagnoses     Benign paroxysmal positional vertigo, unspecified  laterality    -  Primary   Encounter for immunization       Relevant Orders   Flu vaccine trivalent PF, 6mos and older(Flulaval,Afluria,Fluarix,Fluzone) (Completed)          Benign Paroxysmal Positional Vertigo (BPPV) Recurrent episodes of vertigo with positional changes, improved temporarily with Epley maneuver but symptoms recur. -Continue daily Epley maneuver for a week. -Consider referral to physical therapy for more precise maneuvers if no improvement in a week. - meclizine prn  Grover's Disease History of severe episodes, currently controlled with Dupixent. -Continue current management.  COVID-19 Vaccination Patient has received all previous doses, but is hesitant to receive additional dose due to concerns about potential flare of Grover's disease. -Discussed risks and benefits. Patient opted to hold off on additional vaccination for now.  General Health Maintenance -Received flu shot.        Meds ordered this encounter  Medications   meclizine (ANTIVERT) 25 MG tablet    Sig: Take 1 tablet (25 mg total) by mouth 3 (three) times daily as needed for dizziness.    Dispense:  30 tablet    Refill:  0    Return if symptoms worsen or fail to improve.  Shirlee Latch, MD

## 2024-04-30 ENCOUNTER — Encounter: Payer: Self-pay | Admitting: Family Medicine
